# Patient Record
Sex: Female | Born: 1985
Health system: Southern US, Community
[De-identification: ages and names within clinical notes are randomized; demographics above are authoritative.]

## PROBLEM LIST (undated history)

## (undated) ENCOUNTER — Inpatient Hospital Stay (HOSPITAL_COMMUNITY): Payer: Self-pay

## (undated) DIAGNOSIS — N189 Chronic kidney disease, unspecified: Secondary | ICD-10-CM

## (undated) DIAGNOSIS — D649 Anemia, unspecified: Secondary | ICD-10-CM

## (undated) DIAGNOSIS — F32A Depression, unspecified: Secondary | ICD-10-CM

## (undated) DIAGNOSIS — F329 Major depressive disorder, single episode, unspecified: Secondary | ICD-10-CM

## (undated) DIAGNOSIS — Z8759 Personal history of other complications of pregnancy, childbirth and the puerperium: Secondary | ICD-10-CM

## (undated) DIAGNOSIS — I10 Essential (primary) hypertension: Secondary | ICD-10-CM

## (undated) DIAGNOSIS — Z87440 Personal history of urinary (tract) infections: Secondary | ICD-10-CM

## (undated) HISTORY — PX: OTHER SURGICAL HISTORY: SHX169

## (undated) HISTORY — DX: Essential (primary) hypertension: I10

## (undated) HISTORY — DX: Personal history of urinary (tract) infections: Z87.440

## (undated) HISTORY — DX: Personal history of urinary (tract) infections: Z87.59

## (undated) HISTORY — DX: Depression, unspecified: F32.A

## (undated) HISTORY — PX: WISDOM TOOTH EXTRACTION: SHX21

## (undated) HISTORY — DX: Major depressive disorder, single episode, unspecified: F32.9

---

## 2004-11-21 ENCOUNTER — Ambulatory Visit (HOSPITAL_COMMUNITY): Admission: RE | Admit: 2004-11-21 | Discharge: 2004-11-21 | Payer: Self-pay | Admitting: Urology

## 2010-07-27 ENCOUNTER — Encounter: Payer: Self-pay | Admitting: Urology

## 2011-05-09 ENCOUNTER — Inpatient Hospital Stay (INDEPENDENT_AMBULATORY_CARE_PROVIDER_SITE_OTHER)
Admission: RE | Admit: 2011-05-09 | Discharge: 2011-05-09 | Disposition: A | Discharge status: Home / Self Care | Payer: Commercial Managed Care - PPO | Source: Ambulatory Visit | Attending: Family Medicine | Admitting: Family Medicine

## 2011-05-09 DIAGNOSIS — N390 Urinary tract infection, site not specified: Secondary | ICD-10-CM

## 2011-05-09 DIAGNOSIS — J029 Acute pharyngitis, unspecified: Secondary | ICD-10-CM

## 2011-05-09 LAB — POCT RAPID STREP A: Streptococcus, Group A Screen (Direct): NEGATIVE

## 2011-05-09 LAB — POCT URINALYSIS DIP (DEVICE)
Glucose, UA: NEGATIVE mg/dL
Hgb urine dipstick: NEGATIVE
Ketones, ur: NEGATIVE mg/dL
Protein, ur: NEGATIVE mg/dL
Urobilinogen, UA: 0.2 mg/dL (ref 0.0–1.0)
pH: 6 (ref 5.0–8.0)

## 2015-07-14 ENCOUNTER — Telehealth: Payer: Self-pay | Admitting: Family

## 2015-07-14 DIAGNOSIS — B373 Candidiasis of vulva and vagina: Secondary | ICD-10-CM

## 2015-07-14 DIAGNOSIS — B3731 Acute candidiasis of vulva and vagina: Secondary | ICD-10-CM

## 2015-07-14 DIAGNOSIS — N390 Urinary tract infection, site not specified: Secondary | ICD-10-CM

## 2015-07-14 MED ORDER — CIPROFLOXACIN HCL 500 MG PO TABS: 500.0000 mg | ORAL_TABLET | Freq: Two times a day (BID) | ORAL | Status: DC

## 2015-07-14 MED ORDER — FLUCONAZOLE 150 MG PO TABS: 150.0000 mg | ORAL_TABLET | Freq: Once | ORAL | Status: DC

## 2015-07-14 NOTE — Progress Notes (Signed)
We are sorry that you are not feeling well.  Here is how we plan to help!  Based on what you shared with me it looks like you most likely have a simple urinary tract infection.  A UTI (Urinary Tract Infection) is a bacterial infection of the bladder.  Most cases of urinary tract infections are simple to treat but a key part of your care is to encourage you to drink plenty of fluids and watch your symptoms carefully.  I have prescribed Ciprofloxacin 500 mg twice a day for 5 days.  Your symptoms should gradually improve. Call us if the burning in your urine worsens, you develop worsening fever, back pain or pelvic pain or if your symptoms do not resolve after completing the antibiotic.  I have also prescribed Diflucan 150mg  as per your request for potential yeast infection.   Urinary tract infections can be prevented by drinking plenty of water to keep your body hydrated.  Also be sure when you wipe, wipe from front to back and don't hold it in!  If possible, empty your bladder every 4 hours.  Your e-visit answers were reviewed by a board certified advanced clinical practitioner to complete your personal care plan.  Depending on the condition, your plan could have included both over the counter or prescription medications.  If there is a problem please reply  once you have received a response from your provider.  Your safety is important to [place].  If you have drug allergies check your prescription carefully.    You can use MyChart to ask questions about today's visit, request a non-urgent call back, or ask for a work or school excuse for 24 hours related to this e-Visit. If it has been greater than 24 hours you will need to follow up with your provider, or enter a new e-Visit to address those concerns.   You will get an e-mail in the next two days asking about your experience.  I hope that your e-visit has been valuable and will speed your recovery. Thank you for using e-visits.

## 2015-10-29 DIAGNOSIS — M542 Cervicalgia: Secondary | ICD-10-CM | POA: Diagnosis not present

## 2015-10-29 DIAGNOSIS — M50322 Other cervical disc degeneration at C5-C6 level: Secondary | ICD-10-CM | POA: Diagnosis not present

## 2015-10-29 DIAGNOSIS — R110 Nausea: Secondary | ICD-10-CM | POA: Diagnosis not present

## 2015-10-29 DIAGNOSIS — M25511 Pain in right shoulder: Secondary | ICD-10-CM | POA: Diagnosis not present

## 2015-11-01 DIAGNOSIS — M5412 Radiculopathy, cervical region: Secondary | ICD-10-CM | POA: Diagnosis not present

## 2015-11-01 DIAGNOSIS — M5090 Cervical disc disorder, unspecified, unspecified cervical region: Secondary | ICD-10-CM | POA: Diagnosis not present

## 2015-11-08 ENCOUNTER — Other Ambulatory Visit (HOSPITAL_BASED_OUTPATIENT_CLINIC_OR_DEPARTMENT_OTHER): Payer: Self-pay | Admitting: Sports Medicine

## 2015-11-08 DIAGNOSIS — M5412 Radiculopathy, cervical region: Secondary | ICD-10-CM

## 2015-11-23 ENCOUNTER — Ambulatory Visit (HOSPITAL_BASED_OUTPATIENT_CLINIC_OR_DEPARTMENT_OTHER)
Admission: RE | Admit: 2015-11-23 | Discharge: 2015-11-23 | Disposition: A | Discharge status: Home / Self Care | Payer: 59 | Source: Ambulatory Visit | Attending: Sports Medicine | Admitting: Sports Medicine

## 2015-11-23 DIAGNOSIS — M50221 Other cervical disc displacement at C4-C5 level: Secondary | ICD-10-CM | POA: Diagnosis not present

## 2015-11-23 DIAGNOSIS — M50222 Other cervical disc displacement at C5-C6 level: Secondary | ICD-10-CM | POA: Insufficient documentation

## 2015-11-23 DIAGNOSIS — M4802 Spinal stenosis, cervical region: Secondary | ICD-10-CM | POA: Insufficient documentation

## 2015-11-23 DIAGNOSIS — M5412 Radiculopathy, cervical region: Secondary | ICD-10-CM | POA: Diagnosis not present

## 2015-11-23 DIAGNOSIS — M50223 Other cervical disc displacement at C6-C7 level: Secondary | ICD-10-CM | POA: Insufficient documentation

## 2015-11-29 ENCOUNTER — Telehealth: Payer: 59 | Admitting: Family

## 2015-11-29 DIAGNOSIS — B373 Candidiasis of vulva and vagina: Secondary | ICD-10-CM | POA: Diagnosis not present

## 2015-11-29 DIAGNOSIS — N390 Urinary tract infection, site not specified: Secondary | ICD-10-CM

## 2015-11-29 DIAGNOSIS — B3731 Acute candidiasis of vulva and vagina: Secondary | ICD-10-CM

## 2015-11-29 MED ORDER — CIPROFLOXACIN HCL 500 MG PO TABS: 500.0000 mg | ORAL_TABLET | Freq: Two times a day (BID) | ORAL | Status: DC

## 2015-11-29 MED ORDER — FLUCONAZOLE 150 MG PO TABS: 150.0000 mg | ORAL_TABLET | Freq: Once | ORAL | Status: DC

## 2015-11-29 NOTE — Progress Notes (Signed)
We are sorry that you are not feeling well.  Here is how we plan to help!  Based on what you shared with me it looks like you most likely have a simple urinary tract infection.  A UTI (Urinary Tract Infection) is a bacterial infection of the bladder.  Most cases of urinary tract infections are simple to treat but a key part of your care is to encourage you to drink plenty of fluids and watch your symptoms carefully.  I have prescribed Ciprofloxacin 500 mg twice a day for 5 days.  Your symptoms should gradually improve. Call us if the burning in your urine worsens, you develop worsening fever, back pain or pelvic pain or if your symptoms do not resolve after completing the antibiotic.  I have also prescribed Diflucan 150mg  x 1 dose as requested.   Urinary tract infections can be prevented by drinking plenty of water to keep your body hydrated.  Also be sure when you wipe, wipe from front to back and don't hold it in!  If possible, empty your bladder every 4 hours.  Your e-visit answers were reviewed by a board certified advanced clinical practitioner to complete your personal care plan.  Depending on the condition, your plan could have included both over the counter or prescription medications.  If there is a problem please reply  once you have received a response from your provider.  Your safety is important to [place].  If you have drug allergies check your prescription carefully.    You can use MyChart to ask questions about today's visit, request a non-urgent call back, or ask for a work or school excuse for 24 hours related to this e-Visit. If it has been greater than 24 hours you will need to follow up with your provider, or enter a new e-Visit to address those concerns.   You will get an e-mail in the next two days asking about your experience.  I hope that your e-visit has been valuable and will speed your recovery. Thank you for using e-visits.

## 2016-01-30 ENCOUNTER — Telehealth: Payer: 59 | Admitting: Physician Assistant

## 2016-01-30 DIAGNOSIS — N390 Urinary tract infection, site not specified: Secondary | ICD-10-CM | POA: Diagnosis not present

## 2016-01-30 DIAGNOSIS — B3731 Acute candidiasis of vulva and vagina: Secondary | ICD-10-CM

## 2016-01-30 DIAGNOSIS — B373 Candidiasis of vulva and vagina: Secondary | ICD-10-CM | POA: Diagnosis not present

## 2016-01-30 MED ORDER — FLUCONAZOLE 150 MG PO TABS: 150.0000 mg | ORAL_TABLET | Freq: Once | ORAL | 0 refills | Status: AC

## 2016-01-30 MED ORDER — CIPROFLOXACIN HCL 500 MG PO TABS: 500.0000 mg | ORAL_TABLET | Freq: Two times a day (BID) | ORAL | 0 refills | Status: DC

## 2016-01-30 MED FILL — FLUCONAZOLE 150 MG TABLET: 150 | 1 days supply | Qty: 1 | Fill #0

## 2016-01-30 MED FILL — CIPROFLOXACIN HCL 500 MG TA: 500 | 5 days supply | Qty: 10 | Fill #0

## 2016-01-30 NOTE — Progress Notes (Signed)
We are sorry that you are not feeling well.  Here is how we plan to help!  Based on what you shared with me it looks like you most likely have a simple urinary tract infection.  A UTI (Urinary Tract Infection) is a bacterial infection of the bladder.  Most cases of urinary tract infections are simple to treat but a key part of your care is to encourage you to drink plenty of fluids and watch your symptoms carefully.  I have prescribed Ciprofloxacin 500 mg twice a day for 5 days. I have also sent in 1 Diflucan in case of yeast from antibiotic use.  Your symptoms should gradually improve. Call us if the burning in your urine worsens, you develop worsening fever, back pain or pelvic pain or if your symptoms do not resolve after completing the antibiotic.  Urinary tract infections can be prevented by drinking plenty of water to keep your body hydrated.  Also be sure when you wipe, wipe from front to back and don't hold it in!  If possible, empty your bladder every 4 hours.  Your e-visit answers were reviewed by a board certified advanced clinical practitioner to complete your personal care plan.  Depending on the condition, your plan could have included both over the counter or prescription medications.  If there is a problem please reply  once you have received a response from your provider.  Your safety is important to [place].  If you have drug allergies check your prescription carefully.    You can use MyChart to ask questions about today's visit, request a non-urgent call back, or ask for a work or school excuse for 24 hours related to this e-Visit. If it has been greater than 24 hours you will need to follow up with your provider, or enter a new e-Visit to address those concerns.   You will get an e-mail in the next two days asking about your experience.  I hope that your e-visit has been valuable and will speed your recovery. Thank you for using e-visits.

## 2016-02-14 DIAGNOSIS — R42 Dizziness and giddiness: Secondary | ICD-10-CM | POA: Diagnosis not present

## 2016-02-14 DIAGNOSIS — R55 Syncope and collapse: Secondary | ICD-10-CM | POA: Diagnosis not present

## 2016-02-14 DIAGNOSIS — R000 Tachycardia, unspecified: Secondary | ICD-10-CM | POA: Diagnosis not present

## 2016-02-14 DIAGNOSIS — R5381 Other malaise: Secondary | ICD-10-CM | POA: Diagnosis not present

## 2016-02-14 DIAGNOSIS — R5383 Other fatigue: Secondary | ICD-10-CM | POA: Diagnosis not present

## 2016-04-08 DIAGNOSIS — R6882 Decreased libido: Secondary | ICD-10-CM | POA: Diagnosis not present

## 2016-04-08 DIAGNOSIS — N898 Other specified noninflammatory disorders of vagina: Secondary | ICD-10-CM | POA: Diagnosis not present

## 2016-04-10 DIAGNOSIS — M5412 Radiculopathy, cervical region: Secondary | ICD-10-CM | POA: Diagnosis not present

## 2016-04-10 DIAGNOSIS — M5090 Cervical disc disorder, unspecified, unspecified cervical region: Secondary | ICD-10-CM | POA: Diagnosis not present

## 2016-04-23 ENCOUNTER — Telehealth: Payer: 59 | Admitting: Physician Assistant

## 2016-04-23 DIAGNOSIS — R300 Dysuria: Secondary | ICD-10-CM

## 2016-04-23 DIAGNOSIS — N390 Urinary tract infection, site not specified: Secondary | ICD-10-CM

## 2016-04-23 MED ORDER — CIPROFLOXACIN HCL 500 MG PO TABS: 500.0000 mg | ORAL_TABLET | Freq: Two times a day (BID) | ORAL | 0 refills | Status: DC

## 2016-04-23 MED FILL — CIPROFLOXACIN HCL 500 MG TA: 500 | 5 days supply | Qty: 10 | Fill #0

## 2016-04-23 NOTE — Progress Notes (Signed)

## 2016-06-01 DIAGNOSIS — H52223 Regular astigmatism, bilateral: Secondary | ICD-10-CM | POA: Diagnosis not present

## 2016-06-01 DIAGNOSIS — H5213 Myopia, bilateral: Secondary | ICD-10-CM | POA: Diagnosis not present

## 2016-07-05 ENCOUNTER — Telehealth: Payer: 59 | Admitting: Nurse Practitioner

## 2016-07-05 DIAGNOSIS — J0101 Acute recurrent maxillary sinusitis: Secondary | ICD-10-CM

## 2016-07-05 MED ORDER — AMOXICILLIN-POT CLAVULANATE 875-125 MG PO TABS: 1.0000 | ORAL_TABLET | Freq: Two times a day (BID) | ORAL | 0 refills | Status: DC

## 2016-07-05 NOTE — Progress Notes (Signed)

## 2016-08-09 ENCOUNTER — Telehealth: Payer: 59 | Admitting: Family

## 2016-08-09 DIAGNOSIS — R399 Unspecified symptoms and signs involving the genitourinary system: Secondary | ICD-10-CM | POA: Diagnosis not present

## 2016-08-09 MED ORDER — NITROFURANTOIN MONOHYD MACRO 100 MG PO CAPS: 100.0000 mg | ORAL_CAPSULE | Freq: Two times a day (BID) | ORAL | 0 refills | Status: DC

## 2016-08-09 NOTE — Progress Notes (Signed)

## 2016-09-29 ENCOUNTER — Telehealth: Payer: 59 | Admitting: Nurse Practitioner

## 2016-09-29 DIAGNOSIS — O2311 Infections of bladder in pregnancy, first trimester: Secondary | ICD-10-CM | POA: Diagnosis not present

## 2016-09-29 MED ORDER — NITROFURANTOIN MONOHYD MACRO 100 MG PO CAPS: 100.0000 mg | ORAL_CAPSULE | Freq: Two times a day (BID) | ORAL | 0 refills | Status: AC

## 2016-09-29 MED FILL — NITROFURANTOIN MONO-MCR 100: 100 | 5 days supply | Qty: 10 | Fill #0

## 2016-09-29 NOTE — Progress Notes (Signed)
We are sorry that you are not feeling well.  Here is how we plan to help!  Based on what you shared with me it looks like you most likely have a simple urinary tract infection.  A UTI (Urinary Tract Infection) is a bacterial infection of the bladder.  Most cases of urinary tract infections are simple to treat but a key part of your care is to encourage you to drink plenty of fluids and watch your symptoms carefully.  I have prescribed MacroBid 100 mg twice a day for 5 days.  Your symptoms should gradually improve. Call us if the burning in your urine worsens, you develop worsening fever, back pain or pelvic pain or if your symptoms do not resolve after completing the antibiotic.  Urinary tract infections can be prevented by drinking plenty of water to keep your body hydrated.  Also be sure when you wipe, wipe from front to back and don't hold it in!  If possible, empty your bladder every 4 hours.  You should follow up with your OB/Gyn as soon as possible due to the frequency of your urinary symptoms.    Your e-visit answers were reviewed by a board certified advanced clinical practitioner to complete your personal care plan.  Depending on the condition, your plan could have included both over the counter or prescription medications.  If there is a problem please reply  once you have received a response from your provider.  Your safety is important to [place].  If you have drug allergies check your prescription carefully.    You can use MyChart to ask questions about today's visit, request a non-urgent call back, or ask for a work or school excuse for 24 hours related to this e-Visit. If it has been greater than 24 hours you will need to follow up with your provider, or enter a new e-Visit to address those concerns.   You will get an e-mail in the next two days asking about your experience.  I hope that your e-visit has been valuable and will speed your recovery. Thank you for using  e-visits.

## 2016-10-01 DIAGNOSIS — O209 Hemorrhage in early pregnancy, unspecified: Secondary | ICD-10-CM | POA: Diagnosis not present

## 2016-10-06 DIAGNOSIS — O039 Complete or unspecified spontaneous abortion without complication: Secondary | ICD-10-CM | POA: Diagnosis not present

## 2016-10-13 DIAGNOSIS — O021 Missed abortion: Secondary | ICD-10-CM | POA: Diagnosis not present

## 2016-10-13 DIAGNOSIS — O034 Incomplete spontaneous abortion without complication: Secondary | ICD-10-CM | POA: Diagnosis not present

## 2016-10-22 DIAGNOSIS — O021 Missed abortion: Secondary | ICD-10-CM | POA: Diagnosis not present

## 2017-05-18 MED FILL — valACYclovir HCL 1 GM TABS: 1 | 1 days supply | Qty: 4 | Fill #0

## 2017-06-08 ENCOUNTER — Telehealth: Payer: 59 | Admitting: Family

## 2017-06-08 DIAGNOSIS — J019 Acute sinusitis, unspecified: Secondary | ICD-10-CM

## 2017-06-08 DIAGNOSIS — B9689 Other specified bacterial agents as the cause of diseases classified elsewhere: Secondary | ICD-10-CM | POA: Diagnosis not present

## 2017-06-08 MED ORDER — AMOXICILLIN-POT CLAVULANATE 875-125 MG PO TABS: 1.0000 | ORAL_TABLET | Freq: Two times a day (BID) | ORAL | 0 refills | Status: DC

## 2017-06-08 MED FILL — AMOX-CLAV 875-125 MG TABLET: 875-125 | 7 days supply | Qty: 14 | Fill #0

## 2017-06-08 NOTE — Progress Notes (Signed)

## 2017-07-06 ENCOUNTER — Telehealth: Payer: 59 | Admitting: Family

## 2017-07-06 DIAGNOSIS — J019 Acute sinusitis, unspecified: Secondary | ICD-10-CM

## 2017-07-06 DIAGNOSIS — B9689 Other specified bacterial agents as the cause of diseases classified elsewhere: Secondary | ICD-10-CM

## 2017-07-06 MED ORDER — CEPHALEXIN 500 MG PO CAPS: 500.0000 mg | ORAL_CAPSULE | Freq: Three times a day (TID) | ORAL | 0 refills | Status: DC

## 2017-07-06 NOTE — L&D Delivery Note (Signed)
Delivery Note At 9:22 AM a viable female was delivered via VBAC, Spontaneous (PresentationOA: ;  ).  APGAR: , ; weight  .   Placenta statusSPONT..INTCT: , .  Cord:  with the following complications: .  Cord pH: not sen  Anesthesia:  EPID Episiotomy: None Lacerations: FIRST DEG  Suture Repair: 3.0 vicryl rapide Est. Blood Loss (mL):   300 Mom to postpartum.  Baby to Couplet care / Skin to Skin.  [name] 02/17/2018, 9:31 AM

## 2017-07-06 NOTE — Progress Notes (Signed)
We are sorry that you are not feeling well.  Here is how we plan to help!  Based on what you have shared with me it looks like you have sinusitis.  Sinusitis is inflammation and infection in the sinus cavities of the head.  Based on your presentation I believe you most likely have Acute Bacterial Sinusitis.  This is an infection caused by bacteria and is treated with antibiotics. I have prescribed Keflex 500 mg three times a day for 7 days. This is safe during pregnancy. You may use an oral decongestant such as Mucinex D or if you have glaucoma or high blood pressure use plain Mucinex. Saline nasal spray help and can safely be used as often as needed for congestion.  If you develop worsening sinus pain, fever or notice severe headache and vision changes, or if symptoms are not better after completion of antibiotic, please schedule an appointment with a health care provider.    Sinus infections are not as easily transmitted as other respiratory infection, however we still recommend that you avoid close contact with loved ones, especially the very young and elderly.  Remember to wash your hands thoroughly throughout the day as this is the number one way to prevent the spread of infection!  Home Care:  Only take medications as instructed by your medical team.  Complete the entire course of an antibiotic.  Do not take these medications with alcohol.  A steam or ultrasonic humidifier can help congestion.  You can place a towel over your head and breathe in the steam from hot water coming from a faucet.  Avoid close contacts especially the very young and the elderly.  Cover your mouth when you cough or sneeze.  Always remember to wash your hands.  Get Help Right Away If:  You develop worsening fever or sinus pain.  You develop a severe head ache or visual changes.  Your symptoms persist after you have completed your treatment plan.  Make sure you  Understand these instructions.  Will  watch your condition.  Will get help right away if you are not doing well or get worse.  Your e-visit answers were reviewed by a board certified advanced clinical practitioner to complete your personal care plan.  Depending on the condition, your plan could have included both over the counter or prescription medications.  If there is a problem please reply  once you have received a response from your provider.  Your safety is important to [place].  If you have drug allergies check your prescription carefully.    You can use MyChart to ask questions about today's visit, request a non-urgent call back, or ask for a work or school excuse for 24 hours related to this e-Visit. If it has been greater than 24 hours you will need to follow up with your provider, or enter a new e-Visit to address those concerns.  You will get an e-mail in the next two days asking about your experience.  I hope that your e-visit has been valuable and will speed your recovery. Thank you for using e-visits.

## 2017-07-14 DIAGNOSIS — N911 Secondary amenorrhea: Secondary | ICD-10-CM | POA: Diagnosis not present

## 2017-07-14 MED FILL — DICLEGIS DR 10-10 MG TABLET: 10-10 | 30 days supply | Qty: 60 | Fill #0

## 2017-07-22 DIAGNOSIS — Z3685 Encounter for antenatal screening for Streptococcus B: Secondary | ICD-10-CM | POA: Diagnosis not present

## 2017-07-22 DIAGNOSIS — Z3481 Encounter for supervision of other normal pregnancy, first trimester: Secondary | ICD-10-CM | POA: Diagnosis not present

## 2017-07-22 LAB — OB RESULTS CONSOLE ABO/RH: RH TYPE: POSITIVE

## 2017-07-22 LAB — OB RESULTS CONSOLE HEPATITIS B SURFACE ANTIGEN: Hepatitis B Surface Ag: NEGATIVE

## 2017-07-22 LAB — OB RESULTS CONSOLE RUBELLA ANTIBODY, IGM: Rubella: NON-IMMUNE/NOT IMMUNE

## 2017-07-22 LAB — OB RESULTS CONSOLE GC/CHLAMYDIA
CHLAMYDIA, DNA PROBE: NEGATIVE
Gonorrhea: NEGATIVE

## 2017-07-22 LAB — OB RESULTS CONSOLE RPR: RPR: NONREACTIVE

## 2017-07-22 LAB — OB RESULTS CONSOLE ANTIBODY SCREEN: Antibody Screen: NEGATIVE

## 2017-07-22 LAB — OB RESULTS CONSOLE HIV ANTIBODY (ROUTINE TESTING): HIV: NONREACTIVE

## 2017-08-03 DIAGNOSIS — Z113 Encounter for screening for infections with a predominantly sexual mode of transmission: Secondary | ICD-10-CM | POA: Diagnosis not present

## 2017-08-03 DIAGNOSIS — Z3480 Encounter for supervision of other normal pregnancy, unspecified trimester: Secondary | ICD-10-CM | POA: Diagnosis not present

## 2017-08-03 DIAGNOSIS — Z3491 Encounter for supervision of normal pregnancy, unspecified, first trimester: Secondary | ICD-10-CM | POA: Diagnosis not present

## 2017-08-10 DIAGNOSIS — Z3683 Encounter for fetal screening for congenital cardiac abnormalities: Secondary | ICD-10-CM | POA: Diagnosis not present

## 2017-08-10 DIAGNOSIS — Z36 Encounter for antenatal screening for chromosomal anomalies: Secondary | ICD-10-CM | POA: Diagnosis not present

## 2017-08-10 DIAGNOSIS — Z3491 Encounter for supervision of normal pregnancy, unspecified, first trimester: Secondary | ICD-10-CM | POA: Diagnosis not present

## 2017-08-10 DIAGNOSIS — Z3A12 12 weeks gestation of pregnancy: Secondary | ICD-10-CM | POA: Diagnosis not present

## 2017-08-26 MED FILL — NITROFURANTOIN MONO-MCR 100: 100 | 15 days supply | Qty: 30 | Fill #0

## 2017-09-02 DIAGNOSIS — Z3480 Encounter for supervision of other normal pregnancy, unspecified trimester: Secondary | ICD-10-CM | POA: Diagnosis not present

## 2017-09-24 DIAGNOSIS — Z363 Encounter for antenatal screening for malformations: Secondary | ICD-10-CM | POA: Diagnosis not present

## 2017-09-24 DIAGNOSIS — Z3400 Encounter for supervision of normal first pregnancy, unspecified trimester: Secondary | ICD-10-CM | POA: Diagnosis not present

## 2017-10-20 DIAGNOSIS — M25571 Pain in right ankle and joints of right foot: Secondary | ICD-10-CM | POA: Diagnosis not present

## 2017-11-12 DIAGNOSIS — Z3492 Encounter for supervision of normal pregnancy, unspecified, second trimester: Secondary | ICD-10-CM | POA: Diagnosis not present

## 2017-11-12 DIAGNOSIS — Z3686 Encounter for antenatal screening for cervical length: Secondary | ICD-10-CM | POA: Diagnosis not present

## 2017-11-17 DIAGNOSIS — Z3480 Encounter for supervision of other normal pregnancy, unspecified trimester: Secondary | ICD-10-CM | POA: Diagnosis not present

## 2017-11-17 DIAGNOSIS — Z23 Encounter for immunization: Secondary | ICD-10-CM | POA: Diagnosis not present

## 2017-11-17 DIAGNOSIS — Z3400 Encounter for supervision of normal first pregnancy, unspecified trimester: Secondary | ICD-10-CM | POA: Diagnosis not present

## 2017-11-30 MED FILL — ESOMEPRAZOLE MAG DR 20 MG C: 20 | 30 days supply | Qty: 30 | Fill #0

## 2017-12-07 DIAGNOSIS — Z3A29 29 weeks gestation of pregnancy: Secondary | ICD-10-CM | POA: Diagnosis not present

## 2017-12-07 DIAGNOSIS — Z3400 Encounter for supervision of normal first pregnancy, unspecified trimester: Secondary | ICD-10-CM | POA: Diagnosis not present

## 2017-12-07 DIAGNOSIS — Z3686 Encounter for antenatal screening for cervical length: Secondary | ICD-10-CM | POA: Diagnosis not present

## 2017-12-28 DIAGNOSIS — Z3A32 32 weeks gestation of pregnancy: Secondary | ICD-10-CM | POA: Diagnosis not present

## 2017-12-28 DIAGNOSIS — Z3400 Encounter for supervision of normal first pregnancy, unspecified trimester: Secondary | ICD-10-CM | POA: Diagnosis not present

## 2017-12-28 DIAGNOSIS — Z3686 Encounter for antenatal screening for cervical length: Secondary | ICD-10-CM | POA: Diagnosis not present

## 2017-12-29 DIAGNOSIS — H5213 Myopia, bilateral: Secondary | ICD-10-CM | POA: Diagnosis not present

## 2017-12-29 DIAGNOSIS — H52223 Regular astigmatism, bilateral: Secondary | ICD-10-CM | POA: Diagnosis not present

## 2018-01-28 DIAGNOSIS — Z3480 Encounter for supervision of other normal pregnancy, unspecified trimester: Secondary | ICD-10-CM | POA: Diagnosis not present

## 2018-02-02 DIAGNOSIS — Z3400 Encounter for supervision of normal first pregnancy, unspecified trimester: Secondary | ICD-10-CM | POA: Diagnosis not present

## 2018-02-02 DIAGNOSIS — O368130 Decreased fetal movements, third trimester, not applicable or unspecified: Secondary | ICD-10-CM | POA: Diagnosis not present

## 2018-02-02 DIAGNOSIS — Z3A37 37 weeks gestation of pregnancy: Secondary | ICD-10-CM | POA: Diagnosis not present

## 2018-02-12 ENCOUNTER — Encounter (HOSPITAL_COMMUNITY): Payer: Self-pay

## 2018-02-12 ENCOUNTER — Inpatient Hospital Stay (HOSPITAL_COMMUNITY)
Admission: AD | Admit: 2018-02-12 | Discharge: 2018-02-12 | Disposition: A | Discharge status: Home / Self Care | Payer: 59 | Source: Ambulatory Visit | Attending: Obstetrics and Gynecology | Admitting: Obstetrics and Gynecology

## 2018-02-12 ENCOUNTER — Other Ambulatory Visit: Payer: Self-pay

## 2018-02-12 DIAGNOSIS — O2343 Unspecified infection of urinary tract in pregnancy, third trimester: Secondary | ICD-10-CM

## 2018-02-12 DIAGNOSIS — O26833 Pregnancy related renal disease, third trimester: Secondary | ICD-10-CM | POA: Diagnosis not present

## 2018-02-12 DIAGNOSIS — O26893 Other specified pregnancy related conditions, third trimester: Secondary | ICD-10-CM | POA: Diagnosis not present

## 2018-02-12 DIAGNOSIS — Z882 Allergy status to sulfonamides status: Secondary | ICD-10-CM | POA: Insufficient documentation

## 2018-02-12 DIAGNOSIS — R109 Unspecified abdominal pain: Secondary | ICD-10-CM | POA: Diagnosis not present

## 2018-02-12 DIAGNOSIS — Z3A38 38 weeks gestation of pregnancy: Secondary | ICD-10-CM

## 2018-02-12 DIAGNOSIS — N189 Chronic kidney disease, unspecified: Secondary | ICD-10-CM | POA: Insufficient documentation

## 2018-02-12 HISTORY — DX: Anemia, unspecified: D64.9

## 2018-02-12 HISTORY — DX: Chronic kidney disease, unspecified: N18.9

## 2018-02-12 LAB — URINALYSIS, ROUTINE W REFLEX MICROSCOPIC
Bilirubin Urine: NEGATIVE
GLUCOSE, UA: NEGATIVE mg/dL
Ketones, ur: NEGATIVE mg/dL
NITRITE: NEGATIVE
PROTEIN: NEGATIVE mg/dL
Specific Gravity, Urine: 1.005 (ref 1.005–1.030)
WBC, UA: >50 WBC/hpf — ABNORMAL HIGH (ref 0–5)
pH: 8 (ref 5.0–8.0)

## 2018-02-12 LAB — CBC WITH DIFFERENTIAL/PLATELET
Basophils Absolute: 0 10*3/uL (ref 0.0–0.1)
Basophils Relative: 0 %
Eosinophils Absolute: 0.1 10*3/uL (ref 0.0–0.7)
Eosinophils Relative: 1 %
HEMATOCRIT: 31.7 % — AB (ref 36.0–46.0)
Hemoglobin: 10.7 g/dL — ABNORMAL LOW (ref 12.0–15.0)
Lymphocytes Relative: 14 %
Lymphs Abs: 1.4 10*3/uL (ref 0.7–4.0)
MCH: 31.6 pg (ref 26.0–34.0)
MCHC: 33.8 g/dL (ref 30.0–36.0)
MCV: 93.5 fL (ref 78.0–100.0)
MONO ABS: 0.5 10*3/uL (ref 0.1–1.0)
MONOS PCT: 5 %
NEUTROS ABS: 7.9 10*3/uL — AB (ref 1.7–7.7)
Neutrophils Relative %: 80 %
Platelets: 137 10*3/uL — ABNORMAL LOW (ref 150–400)
RBC: 3.39 MIL/uL — ABNORMAL LOW (ref 3.87–5.11)
RDW: 14.3 % (ref 11.5–15.5)
WBC: 9.9 10*3/uL (ref 4.0–10.5)

## 2018-02-12 LAB — POCT PREGNANCY, URINE: Preg Test, Ur: POSITIVE — AB

## 2018-02-12 MED ORDER — CEFTRIAXONE SODIUM 1 G IJ SOLR
1.0000 g | INTRAMUSCULAR | Status: DC
  Administered 2018-02-12: 1 g via INTRAMUSCULAR
  Filled 2018-02-12: qty 10

## 2018-02-12 MED ORDER — FLUCONAZOLE 200 MG PO TABS: 200.0000 mg | ORAL_TABLET | Freq: Every day | ORAL | 0 refills | Status: DC

## 2018-02-12 MED ORDER — GI COCKTAIL ~~LOC~~: 30.0000 mL | Freq: Three times a day (TID) | ORAL | Status: DC | PRN

## 2018-02-12 MED ORDER — CEPHALEXIN 500 MG PO CAPS: 500.0000 mg | ORAL_CAPSULE | Freq: Four times a day (QID) | ORAL | 0 refills | Status: DC

## 2018-02-12 NOTE — Progress Notes (Addendum)
G5 P3 @ [redacted] wksga. Presents to triage for "UTI, flank pain, cramping". This started past Thursday.   Currently on prophylactic abx for chronic UTI  States hurts when voiding and has a smell to her urine when she has the UTI. Also states feels like she is unable to empty bladder completely  Denies LOF or bleeding. +FM. Picks up more thru the day.   1114 Urine collected and sent  1115: EFM applied.   Hx:  1st: SVD 2nd emergency c/s for fetal distress 3rd: VBAC 4th Plans for another VBAC   GBS done and noted negative on the 02/06/18 OB Prenatal entry  1212: checked on pt. Pt made aware that provider will be in the room shortly.   1234: provider at bs assessing.   Monitors off per provider.  1338: CBC resulted. Provider made aware.   1352: orders received for Rocephin IM injection. Pending verification from pharmacy. Pt made aware that Dr. Matthew Saras ordered for rocephin injection 1407: pharmacy notified for rocephin verification   1420 medicated per order 1424: provider made aware of pt wanting prophylaxis med for yeast infection dt several abx regimen.   D/c instructions received. Informed will d/c after 15-20 mins of receiving the IM injection. Pt verbalizes understanding.   1458: D/c instructions given along with abx orders with pt understanding. Pt left unit via ambulatory with SO

## 2018-02-12 NOTE — MAU Provider Note (Signed)
History   32-year-old G5 P0-1-1-3 at 38 weeks and 4 days and with bilateral flank pain that is been going on for several days it is worse on the right patient denies any fever.  She states she has pain with urination she also notices a odor to her urine.  He states she does have a history of frequent UTIs she is very aware of when she is starting to get one.  Denies rupture membranes or vaginal bleeding.  Denies any contractions.  CSN: [id number]  Arrival date & time 02/12/18  1055   None     No chief complaint on file.   HPI  Past Medical History:  Diagnosis Date  . Anemia    1st pregnancy - Fe  . Chronic kidney disease    all pregnancy dx pylonephristis - hospitalized + kidney stones  . Preterm labor      Family History  Problem Relation Age of Onset  . COPD Mother   . Vision loss Mother   . Alcohol abuse Father   . Depression Father   . Drug abuse Father   . ADD / ADHD Son   . Asthma Maternal Grandmother   . Heart disease Paternal Grandmother   . Stroke Paternal Grandmother   . Cancer Paternal Grandfather   . Diabetes Paternal Grandfather   . Heart disease Paternal Grandfather   . Hyperlipidemia Paternal Grandfather   . Hypertension Paternal Grandfather     Social History   Tobacco Use  . Smoking status: Never Smoker  . Smokeless tobacco: Never Used  Substance Use Topics  . Alcohol use: Not Currently    Comment: social   . Drug use: Never    OB History    Gravida  5   Para  3   Term      Preterm  1   AB  1   Living  3     SAB  1   TAB      Ectopic      Multiple      Live Births  3           Review of Systems  Constitutional: Negative.   HENT: Negative.   Eyes: Negative.   Respiratory: Negative.   Cardiovascular: Negative.   Gastrointestinal:       Superpubic pain with voiding.  Endocrine: Negative.   Genitourinary: Positive for dysuria and flank pain.  Skin: Negative.   Allergic/Immunologic: Negative.   Neurological:  Negative.   Hematological: Negative.   Psychiatric/Behavioral: Negative.     Allergies  Sulfa antibiotics  Home Medications    BP 127/72   Pulse 79   Temp 97.8 F (36.6 C) (Oral)   Resp 18   Wt 64.9 kg   Physical Exam  Constitutional: She is oriented to person, place, and time. She appears well-developed and well-nourished.  HENT:  Head: Normocephalic.  Neck: Normal range of motion.  Cardiovascular: Normal rate and regular rhythm.  Pulmonary/Chest: Effort normal and breath sounds normal.  Abdominal: Soft. Bowel sounds are normal.  Musculoskeletal:  bilateral CVAT worse on right.  Neurological: She is alert and oriented to person, place, and time.  Skin: Skin is warm and dry.  Psychiatric: She has a normal mood and affect. Her behavior is normal. Judgment and thought content normal.    MAU Course  Procedures (including critical care time)  Labs Reviewed  URINALYSIS, ROUTINE W REFLEX MICROSCOPIC - Abnormal; Notable for the following components:  Result Value   APPearance HAZY (*)    Hgb urine dipstick SMALL (*)    Leukocytes, UA LARGE (*)    WBC, UA >50 (*)    Bacteria, UA RARE (*)    All other components within normal limits  POCT PREGNANCY, URINE - Abnormal; Notable for the following components:   Preg Test, Ur POSITIVE (*)    All other components within normal limits  URINE CULTURE  CBC WITH DIFFERENTIAL/PLATELET   No results found.   No diagnosis found.    MDM  Vital signs are stable, urine large leuks greater than 50 WBCs, CBC is normal.  Plan of care discussed with Dr. Matthew Saras.  Given the fact that patient has frequent UTIs and is already on Rocephin Dr. Glyn Ade feels like she needs a shot of Rocephin IM and placed on Keflex 500 4 times daily to follow-up in the office in 1 week and if the culture grows out anything then the office will notify her of the results.  Fetal heart tone is reactive with 15 x 15 A cells no decelerations.  Only occasional  mild contraction patient is unaware of.  Will discharge patient home to follow-up next week.  She is to return if symptoms worsen or fever ensues.

## 2018-02-13 LAB — URINE CULTURE: Culture: NO GROWTH

## 2018-02-16 ENCOUNTER — Telehealth (HOSPITAL_COMMUNITY): Payer: Self-pay | Admitting: *Deleted

## 2018-02-16 ENCOUNTER — Encounter (HOSPITAL_COMMUNITY): Payer: Self-pay | Admitting: *Deleted

## 2018-02-16 LAB — OB RESULTS CONSOLE GBS: GBS: NEGATIVE

## 2018-02-16 NOTE — Telephone Encounter (Signed)
Preadmission screen  

## 2018-02-17 ENCOUNTER — Inpatient Hospital Stay (HOSPITAL_COMMUNITY)
Admission: RE | Admit: 2018-02-17 | Discharge: 2018-02-18 | DRG: 807 | Disposition: A | Discharge status: Home / Self Care | Payer: 59 | Attending: Obstetrics and Gynecology | Admitting: Obstetrics and Gynecology

## 2018-02-17 ENCOUNTER — Encounter (HOSPITAL_COMMUNITY): Payer: Self-pay

## 2018-02-17 ENCOUNTER — Other Ambulatory Visit: Payer: Self-pay

## 2018-02-17 ENCOUNTER — Inpatient Hospital Stay (HOSPITAL_COMMUNITY): Payer: 59 | Admitting: Anesthesiology

## 2018-02-17 DIAGNOSIS — Z3483 Encounter for supervision of other normal pregnancy, third trimester: Secondary | ICD-10-CM | POA: Diagnosis present

## 2018-02-17 DIAGNOSIS — O34219 Maternal care for unspecified type scar from previous cesarean delivery: Secondary | ICD-10-CM | POA: Diagnosis not present

## 2018-02-17 DIAGNOSIS — Z3A39 39 weeks gestation of pregnancy: Secondary | ICD-10-CM | POA: Diagnosis not present

## 2018-02-17 DIAGNOSIS — Z3490 Encounter for supervision of normal pregnancy, unspecified, unspecified trimester: Secondary | ICD-10-CM

## 2018-02-17 LAB — TYPE AND SCREEN
ABO/RH(D): O POS
ANTIBODY SCREEN: NEGATIVE

## 2018-02-17 LAB — CBC
HEMATOCRIT: 33.2 % — AB (ref 36.0–46.0)
HEMOGLOBIN: 10.9 g/dL — AB (ref 12.0–15.0)
MCH: 31.1 pg (ref 26.0–34.0)
MCHC: 32.8 g/dL (ref 30.0–36.0)
MCV: 94.6 fL (ref 78.0–100.0)
Platelets: 186 10*3/uL (ref 150–400)
RBC: 3.51 MIL/uL — ABNORMAL LOW (ref 3.87–5.11)
RDW: 14.4 % (ref 11.5–15.5)
WBC: 9.8 10*3/uL (ref 4.0–10.5)

## 2018-02-17 LAB — ABO/RH: ABO/RH(D): O POS

## 2018-02-17 MED ORDER — WITCH HAZEL-GLYCERIN EX PADS: 1.0000 "application " | MEDICATED_PAD | CUTANEOUS | Status: DC | PRN

## 2018-02-17 MED ORDER — LACTATED RINGERS IV SOLN
INTRAVENOUS | Status: DC
  Administered 2018-02-17 (×2): via INTRAVENOUS

## 2018-02-17 MED ORDER — COCONUT OIL OIL: 1.0000 "application " | TOPICAL_OIL | Status: DC | PRN

## 2018-02-17 MED ORDER — BENZOCAINE-MENTHOL 20-0.5 % EX AERO
1.0000 "application " | INHALATION_SPRAY | CUTANEOUS | Status: DC | PRN
  Administered 2018-02-17: 1 via TOPICAL
  Filled 2018-02-17: qty 56

## 2018-02-17 MED ORDER — FENTANYL 2.5 MCG/ML BUPIVACAINE 1/10 % EPIDURAL INFUSION (WH - ANES)
14.0000 mL/h | INTRAMUSCULAR | Status: DC | PRN
Start: 2018-02-17 — End: 2018-02-18
  Administered 2018-02-17: 14 mL/h via EPIDURAL

## 2018-02-17 MED ORDER — ACETAMINOPHEN 325 MG PO TABS: 650.0000 mg | ORAL_TABLET | ORAL | Status: DC | PRN

## 2018-02-17 MED ORDER — EPHEDRINE 5 MG/ML INJ
10.0000 mg | INTRAVENOUS | Status: DC | PRN
  Filled 2018-02-17: qty 2

## 2018-02-17 MED ORDER — TERBUTALINE SULFATE 1 MG/ML IJ SOLN
0.2500 mg | Freq: Once | INTRAMUSCULAR | Status: DC | PRN
  Filled 2018-02-17: qty 1

## 2018-02-17 MED ORDER — LACTATED RINGERS IV SOLN
500.0000 mL | Freq: Once | INTRAVENOUS | Status: AC
  Administered 2018-02-17: 500 mL via INTRAVENOUS

## 2018-02-17 MED ORDER — SIMETHICONE 80 MG PO CHEW: 80.0000 mg | CHEWABLE_TABLET | ORAL | Status: DC | PRN

## 2018-02-17 MED ORDER — OXYTOCIN 40 UNITS IN LACTATED RINGERS INFUSION - SIMPLE MED
1.0000 m[IU]/min | INTRAVENOUS | Status: DC
  Administered 2018-02-17: 2 m[IU]/min via INTRAVENOUS
  Administered 2018-02-17: 8 m[IU]/min via INTRAVENOUS
  Filled 2018-02-17: qty 1000

## 2018-02-17 MED ORDER — DIPHENHYDRAMINE HCL 25 MG PO CAPS: 25.0000 mg | ORAL_CAPSULE | Freq: Four times a day (QID) | ORAL | Status: DC | PRN

## 2018-02-17 MED ORDER — PRENATAL MULTIVITAMIN CH
1.0000 | ORAL_TABLET | Freq: Every day | ORAL | Status: DC
  Administered 2018-02-18: 1 via ORAL
  Filled 2018-02-17: qty 1

## 2018-02-17 MED ORDER — PHENYLEPHRINE 40 MCG/ML (10ML) SYRINGE FOR IV PUSH (FOR BLOOD PRESSURE SUPPORT)
80.0000 ug | PREFILLED_SYRINGE | INTRAVENOUS | Status: DC | PRN
  Filled 2018-02-17: qty 5

## 2018-02-17 MED ORDER — FLEET ENEMA 7-19 GM/118ML RE ENEM: 1.0000 | ENEMA | Freq: Every day | RECTAL | Status: DC | PRN

## 2018-02-17 MED ORDER — MEASLES, MUMPS & RUBELLA VAC ~~LOC~~ INJ
0.5000 mL | INJECTION | Freq: Once | SUBCUTANEOUS | Status: DC
  Filled 2018-02-17: qty 0.5

## 2018-02-17 MED ORDER — BISACODYL 10 MG RE SUPP: 10.0000 mg | Freq: Every day | RECTAL | Status: DC | PRN

## 2018-02-17 MED ORDER — IBUPROFEN 800 MG PO TABS
800.0000 mg | ORAL_TABLET | Freq: Three times a day (TID) | ORAL | Status: DC | PRN
Start: 2018-02-17 — End: 2018-02-18
  Administered 2018-02-17 – 2018-02-18 (×2): 800 mg via ORAL
  Filled 2018-02-17 (×2): qty 1

## 2018-02-17 MED ORDER — SENNOSIDES-DOCUSATE SODIUM 8.6-50 MG PO TABS: 2.0000 | ORAL_TABLET | ORAL | Status: DC

## 2018-02-17 MED ORDER — OXYTOCIN BOLUS FROM INFUSION
500.0000 mL | Freq: Once | INTRAVENOUS | Status: AC
  Administered 2018-02-17: 500 mL via INTRAVENOUS

## 2018-02-17 MED ORDER — PHENYLEPHRINE 40 MCG/ML (10ML) SYRINGE FOR IV PUSH (FOR BLOOD PRESSURE SUPPORT)
PREFILLED_SYRINGE | INTRAVENOUS | Status: AC
  Filled 2018-02-17: qty 20

## 2018-02-17 MED ORDER — OXYCODONE-ACETAMINOPHEN 5-325 MG PO TABS: 2.0000 | ORAL_TABLET | ORAL | Status: DC | PRN

## 2018-02-17 MED ORDER — LACTATED RINGERS IV SOLN: 500.0000 mL | INTRAVENOUS | Status: DC | PRN

## 2018-02-17 MED ORDER — TETANUS-DIPHTH-ACELL PERTUSSIS 5-2.5-18.5 LF-MCG/0.5 IM SUSP: 0.5000 mL | Freq: Once | INTRAMUSCULAR | Status: DC

## 2018-02-17 MED ORDER — DIPHENHYDRAMINE HCL 50 MG/ML IJ SOLN: 12.5000 mg | INTRAMUSCULAR | Status: DC | PRN

## 2018-02-17 MED ORDER — FENTANYL 2.5 MCG/ML BUPIVACAINE 1/10 % EPIDURAL INFUSION (WH - ANES)
INTRAMUSCULAR | Status: AC
  Filled 2018-02-17: qty 100

## 2018-02-17 MED ORDER — LIDOCAINE HCL (PF) 1 % IJ SOLN
INTRAMUSCULAR | Status: DC | PRN
  Administered 2018-02-17 (×2): 4 mL via EPIDURAL

## 2018-02-17 MED ORDER — OXYTOCIN 40 UNITS IN LACTATED RINGERS INFUSION - SIMPLE MED: 2.5000 [IU]/h | INTRAVENOUS | Status: DC

## 2018-02-17 MED ORDER — ONDANSETRON HCL 4 MG/2ML IJ SOLN: 4.0000 mg | INTRAMUSCULAR | Status: DC | PRN

## 2018-02-17 MED ORDER — OXYCODONE-ACETAMINOPHEN 5-325 MG PO TABS: 1.0000 | ORAL_TABLET | ORAL | Status: DC | PRN

## 2018-02-17 MED ORDER — ONDANSETRON HCL 4 MG/2ML IJ SOLN: 4.0000 mg | Freq: Four times a day (QID) | INTRAMUSCULAR | Status: DC | PRN

## 2018-02-17 MED ORDER — ZOLPIDEM TARTRATE 5 MG PO TABS: 5.0000 mg | ORAL_TABLET | Freq: Every evening | ORAL | Status: DC | PRN

## 2018-02-17 MED ORDER — SOD CITRATE-CITRIC ACID 500-334 MG/5ML PO SOLN: 30.0000 mL | ORAL | Status: DC | PRN

## 2018-02-17 MED ORDER — LIDOCAINE HCL (PF) 1 % IJ SOLN
30.0000 mL | INTRAMUSCULAR | Status: DC | PRN
  Filled 2018-02-17: qty 30

## 2018-02-17 MED ORDER — FLEET ENEMA 7-19 GM/118ML RE ENEM: 1.0000 | ENEMA | RECTAL | Status: DC | PRN

## 2018-02-17 MED ORDER — ONDANSETRON HCL 4 MG PO TABS: 4.0000 mg | ORAL_TABLET | ORAL | Status: DC | PRN

## 2018-02-17 MED ORDER — DIBUCAINE 1 % RE OINT: 1.0000 "application " | TOPICAL_OINTMENT | RECTAL | Status: DC | PRN

## 2018-02-17 NOTE — Lactation Note (Signed)
This note was copied from a baby's chart. Lactation Consultation Note  Patient Name: Ashley Horton THYHO'O Date: 02/17/2018 Reason for consult: Initial assessment;Term  78 hours old FT female who is being exclusively BF by his mother, she's a P4 and experienced BF. She didn't BF her first two kids, she exclusively pumped and bottle for both of them for 3 months, but she did BF her last baby for 7 months. Mom already knows how to hand express and she was able to see some colostrum.  Offered assistance with latch, but mom politely declined, baby has been spitty and she wanted to wait till baby wakes up on his own to try again. Asked her to call for latch assistance if needed. Discussed cluster feeding and baby sleeping cycle. 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  Encouraged mom to feed baby STS 8-12 times/24 hours or sooner if feeding cues are present. Reviewed BF brochure, BF resources and feeding diary, mom is aware of [facility] services and will call PRN.  Maternal Data Formula Feeding for Exclusion: No Has patient been taught Hand Expression?: Yes Does the patient have breastfeeding experience prior to this delivery?: Yes  Feeding Feeding Type: Breast Fed Length of feed: (attempted)  LATCH Score Latch: Grasps breast easily, tongue down, lips flanged, rhythmical sucking.  Audible Swallowing: A few with stimulation  Type of Nipple: Everted at rest and after stimulation  Comfort (Breast/Nipple): Soft / non-tender  Hold (Positioning): No assistance needed to correctly position infant at breast.  LATCH Score: 9  Interventions Interventions: Breast feeding basics reviewed;DEBP  Lactation Tools Discussed/Used Tools: Pump Breast pump type: Double-Electric Breast Pump WIC Program: No   Consult Status Consult Status: PRN Follow-up type: Call as needed    [place] 02/17/2018, 11:41 PM

## 2018-02-17 NOTE — Anesthesia Pain Management Evaluation Note (Signed)
  CRNA Pain Management Visit Note  Patient: Ashley Horton, 32 y.o., female  "Hello I am a member of the anesthesia team at [facility]. We have an anesthesia team available at all times to provide care throughout the hospital, including epidural management and anesthesia for C-section. I don't know your plan for the delivery whether it a natural birth, water birth, IV sedation, nitrous supplementation, doula or epidural, but we want to meet your pain goals."   1.Was your pain managed to your expectations on prior hospitalizations?   Yes   2.What is your expectation for pain management during this hospitalization?     Epidural  3.How can we help you reach that goal? epidural  Record the patient's initial score and the patient's pain goal.   Pain: 0  Pain Goal: 4 [facility] wants you to be able to say your pain was always managed very well.  Ashley Horton 02/17/2018

## 2018-02-17 NOTE — Anesthesia Postprocedure Evaluation (Signed)
Anesthesia Post Note  Patient: Ashley Horton  Procedure(s) Performed: AN AD [place]     Patient location during evaluation: Mother Baby Anesthesia Type: Epidural Level of consciousness: awake and alert Pain management: pain level controlled Vital Signs Assessment: post-procedure vital signs reviewed and stable Respiratory status: spontaneous breathing, nonlabored ventilation and respiratory function stable Cardiovascular status: stable Postop Assessment: no headache, no backache and epidural receding Anesthetic complications: no    Last Vitals:  Vitals:   02/17/18 1132 02/17/18 1146  BP: 108/62 111/64  Pulse: 75 68  Resp: 16 16  Temp:    SpO2:      Last Pain:  Vitals:   02/17/18 1559  TempSrc:   PainSc: 2    Pain Goal: Patients Stated Pain Goal: 7 (02/17/18 0104)               Gilmer Mor

## 2018-02-17 NOTE — H&P (Signed)
Ashley Horton is a 32 y.o. female presenting for IOL  @ term. OB History    Gravida  5   Para  3   Term      Preterm  1   AB  1   Living  3     SAB  1   TAB      Ectopic      Multiple      Live Births  3          Past Medical History:  Diagnosis Date  . Anemia    1st pregnancy - Fe  . Chronic kidney disease    all pregnancy dx pylonephristis - hospitalized + kidney stones  . Depression    third child took zoloft for 2-3 mos  . History of pyelonephritis during pregnancy   . Preterm labor    Past Surgical History:  Procedure Laterality Date  . CESAREAN SECTION    . urethro dilation    . urinary reflux surgery    . WISDOM TOOTH EXTRACTION     at age 37   Family History: family history includes ADD / ADHD in her son; Alcohol abuse in her father; Asthma in her maternal grandmother; COPD in her father; Cancer in her paternal grandfather; Depression in her father; Diabetes in her paternal grandfather; Drug abuse in her father; Heart disease in her paternal grandfather and paternal grandmother; Hyperlipidemia in her paternal grandfather; Hypertension in her paternal grandfather; Stroke in her paternal grandmother; Vision loss in her mother. Social History:  reports that she has never smoked. She has never used smokeless tobacco. She reports that she drank alcohol. She reports that she does not use drugs.     Maternal Diabetes: No Genetic Screening: Normal Maternal Ultrasounds/Referrals: Normal Fetal Ultrasounds or other Referrals:  None Maternal Substance Abuse:  No Significant Maternal Medications:  None Significant Maternal Lab Results:  None Other Comments:  None  ROS History Dilation: 3 Effacement (%): 80 Station: -2 Exam by:: Audria Nine, RN Blood pressure (!) 98/55, pulse 67, temperature (!) 97.5 F (36.4 C), temperature source Oral, resp. rate 14, height 5\' 7"  (1.702 m), weight 66.2 kg. Exam Physical Exam  Constitutional: She is oriented to  person, place, and time. She appears well-developed and well-nourished.  HENT:  Head: Normocephalic and atraumatic.  Neck: Normal range of motion. Neck supple.  Cardiovascular: Normal rate and regular rhythm.  Respiratory: Effort normal and breath sounds normal.  GI:  Term FH, FHR 142  Genitourinary:  Genitourinary Comments: 1/50/vtx  Musculoskeletal: Normal range of motion.  Neurological: She is alert and oriented to person, place, and time.    Prenatal labs: ABO, Rh: --/--/O POS, O POS Performed at [facility], [address], [place], [place] [address]  [phone]) Antibody: NEG (08/15 0115) Rubella: Nonimmune (01/17 0000) RPR: Nonreactive (01/17 0000)  HBsAg: Negative (01/17 0000)  HIV: Non-reactive (01/17 0000)  GBS: Negative (08/14 0000)   Assessment/Plan: Term IUP, for [place] 02/17/2018, 7:22 AM

## 2018-02-17 NOTE — Anesthesia Preprocedure Evaluation (Signed)
Anesthesia Evaluation  Patient identified by MRN, date of birth, ID band Patient awake    Reviewed: Allergy & Precautions, NPO status , Patient's Chart, lab work & pertinent test results  Airway Mallampati: II  TM Distance: >3 FB Neck ROM: Full    Dental   Pulmonary neg pulmonary ROS,    Pulmonary exam normal breath sounds clear to auscultation       Cardiovascular negative cardio ROS Normal cardiovascular exam Rhythm:Regular Rate:Normal     Neuro/Psych PSYCHIATRIC DISORDERS Depression negative neurological ROS     GI/Hepatic negative GI ROS, Neg liver ROS,   Endo/Other  negative endocrine ROS  Renal/GU Renal disease     Musculoskeletal negative musculoskeletal ROS (+)   Abdominal   Peds  Hematology  (+) anemia ,   Anesthesia Other Findings   Reproductive/Obstetrics (+) Pregnancy                             Anesthesia Physical Anesthesia Plan  ASA: II  Anesthesia Plan: Epidural   Post-op Pain Management:    Induction:   PONV Risk Score and Plan:   Airway Management Planned:   Additional Equipment:   Intra-op Plan:   Post-operative Plan:   Informed Consent: I have reviewed the patients History and Physical, chart, labs and discussed the procedure including the risks, benefits and alternatives for the proposed anesthesia with the patient or authorized representative who has indicated his/her understanding and acceptance.     Plan Discussed with:   Anesthesia Plan Comments:         Anesthesia Quick Evaluation

## 2018-02-17 NOTE — Progress Notes (Signed)
Just got epid>>4/90/-1, forebag rupt, FHR cat I

## 2018-02-17 NOTE — Anesthesia Procedure Notes (Signed)
Epidural Patient location during procedure: OB Start time: 02/17/2018 7:20 AM End time: 02/17/2018 7:35 AM  Staffing Anesthesiologist: Nolon Nations, MD Performed: anesthesiologist   Preanesthetic Checklist Completed: patient identified, pre-op evaluation, timeout performed, IV checked, risks and benefits discussed and monitors and equipment checked  Epidural Patient position: sitting Prep: site prepped and draped and DuraPrep Patient monitoring: heart rate, continuous pulse ox and blood pressure Approach: midline Location: L3-L4 Injection technique: LOR air and LOR saline  Needle:  Needle type: Tuohy  Needle gauge: 17 G Needle length: 9 cm Needle insertion depth: 5 cm Catheter type: closed end flexible Catheter size: 19 Gauge Catheter at skin depth: 10 cm Test dose: negative  Assessment Sensory level: T8 Events: blood not aspirated, injection not painful, no injection resistance, negative IV test and no paresthesia  Additional Notes Reason for block:procedure for pain

## 2018-02-18 LAB — CBC
HCT: 28.2 % — ABNORMAL LOW (ref 36.0–46.0)
Hemoglobin: 9.7 g/dL — ABNORMAL LOW (ref 12.0–15.0)
MCH: 32.2 pg (ref 26.0–34.0)
MCHC: 34.4 g/dL (ref 30.0–36.0)
MCV: 93.7 fL (ref 78.0–100.0)
PLATELETS: 146 10*3/uL — AB (ref 150–400)
RBC: 3.01 MIL/uL — AB (ref 3.87–5.11)
RDW: 14.4 % (ref 11.5–15.5)
WBC: 9.7 10*3/uL (ref 4.0–10.5)

## 2018-02-18 LAB — RPR: RPR Ser Ql: NONREACTIVE

## 2018-02-18 NOTE — Lactation Note (Signed)
This note was copied from a baby's chart. Lactation Consultation Note  Patient Name: Boy Areyanna Figeroa [id number] Date: 02/18/2018 Reason for consult: Follow-up assessment;Nipple pain/trauma Baby is 41 hours old and cluster fed last night.  Mom is c/o painful nipples.  She felt baby was latching well yesterday but not as well during the night.  Mom using cradle or football hold.  Baby is currently getting a bath.  Comfort gels given with instructions.  Instructed mom to call out for latch assist when baby ready to feed.  Maternal Data    Feeding    LATCH Score                   Interventions    Lactation Tools Discussed/Used     Consult Status Consult Status: Follow-up Date: 02/18/18 Follow-up type: In-patient    Ave Filter 02/18/2018, 11:41 AM

## 2018-02-18 NOTE — Progress Notes (Signed)
CSW acknowledges consult.  CSW attempted to meet with MOB, however bedside nurse was attending to [facility] and infant.    CSW will attempt to meet with MOB at a later time.   Laurey Arrow, MSW, LCSW Clinical Social Work [phone]

## 2018-02-18 NOTE — Discharge Summary (Signed)
Obstetric Discharge Summary Reason for Admission: induction of labor Prenatal Procedures: none Intrapartum Procedures: spontaneous vaginal delivery Postpartum Procedures: none Complications-Operative and Postpartum: 1st degree perineal laceration Hemoglobin  Date Value Ref Range Status  02/18/2018 9.7 (L) 12.0 - 15.0 g/dL Final   HCT  Date Value Ref Range Status  02/18/2018 28.2 (L) 36.0 - 46.0 % Final    Physical Exam:  General: alert, cooperative and appears stated age 68: appropriate Uterine Fundus: firm Incision: healing well, no significant drainage, no dehiscence, no significant erythema DVT Evaluation: No evidence of DVT seen on physical exam. Negative Homan's sign. No cords or calf tenderness. No significant calf/ankle edema.  Discharge Diagnoses: Term Pregnancy-delivered  Discharge Information: Date: 02/18/2018 Activity: pelvic rest Diet: routine Medications: None Condition: stable Instructions: refer to practice specific booklet Discharge to: home   Newborn Data: Live born female  Birth Weight: 7 lb 2.6 oz (3250 g) APGAR: 9, 9  Newborn Delivery   Birth date/time:  02/17/2018 09:22:00 Delivery type:  VBAC, Spontaneous    Declines circ. Home with mother.  Tyson Dense 02/18/2018, 8:53 AM

## 2018-02-18 NOTE — Progress Notes (Signed)
CSW received consult for hx of PPD.  CSW met with MOB to offer support and complete assessment.    When CSW arrived MOB and FOB were preparing to discharge.  CSW explained CSW's role and MOB gave CSW permission to complete the assessment while FOB was present.   CSW asked about MOB's PPD hx and MOB openly shared feelings of sadness, increase anxiety, and daily tearfulness. MOB shared that MOB's symptoms presented 2 weeks postpartum. MOB communicated that MOB consulted with MOB's PCP and was prescribed Zoloft.  MOB discontinued medication after 2 months and symptoms subsided.   CSW provided education regarding the baby blues period vs. perinatal mood disorders, discussed treatment and gave resources for mental health follow up if concerns arise.  CSW recommends self-evaluation during the postpartum time period using the New Mom Checklist from Postpartum Progress and encouraged MOB to contact a medical professional if symptoms are noted at any time.  CSW assessed for safety and MOB denied SI and HI.  MOB reported having a strong support team and feels prepared to parent.    CSW identifies no further need for intervention and no barriers to discharge at this time.  Laurey Arrow, MSW, LCSW Clinical Social Work [phone]'

## 2018-03-02 ENCOUNTER — Other Ambulatory Visit (HOSPITAL_COMMUNITY): Payer: Self-pay | Admitting: Obstetrics and Gynecology

## 2018-03-02 ENCOUNTER — Ambulatory Visit (HOSPITAL_COMMUNITY)
Admission: RE | Admit: 2018-03-02 | Discharge: 2018-03-02 | Disposition: A | Discharge status: Home / Self Care | Payer: 59 | Source: Ambulatory Visit | Attending: Obstetrics and Gynecology | Admitting: Obstetrics and Gynecology

## 2018-03-02 DIAGNOSIS — R109 Unspecified abdominal pain: Secondary | ICD-10-CM

## 2018-03-02 DIAGNOSIS — N390 Urinary tract infection, site not specified: Secondary | ICD-10-CM | POA: Diagnosis not present

## 2018-03-02 DIAGNOSIS — N10 Acute tubulo-interstitial nephritis: Secondary | ICD-10-CM | POA: Diagnosis not present

## 2018-03-02 MED FILL — AMOX-CLAV 875-125 MG TABLET: 875-125 | 7 days supply | Qty: 14 | Fill #0

## 2018-03-02 MED FILL — OXYCODONE-ACETAMINOPHEN 10-: 10-325 | 3 days supply | Qty: 20 | Fill #0

## 2018-03-14 DIAGNOSIS — Z760 Encounter for issue of repeat prescription: Secondary | ICD-10-CM | POA: Diagnosis not present

## 2018-03-31 DIAGNOSIS — Z1389 Encounter for screening for other disorder: Secondary | ICD-10-CM | POA: Diagnosis not present

## 2018-03-31 MED FILL — SERTRALINE HCL 25 MG TABLET: 25 | 30 days supply | Qty: 30 | Fill #0

## 2018-04-13 DIAGNOSIS — Z3202 Encounter for pregnancy test, result negative: Secondary | ICD-10-CM | POA: Diagnosis not present

## 2018-04-13 DIAGNOSIS — Z30430 Encounter for insertion of intrauterine contraceptive device: Secondary | ICD-10-CM | POA: Diagnosis not present

## 2018-05-13 DIAGNOSIS — D1724 Benign lipomatous neoplasm of skin and subcutaneous tissue of left leg: Secondary | ICD-10-CM | POA: Diagnosis not present

## 2018-05-13 DIAGNOSIS — B079 Viral wart, unspecified: Secondary | ICD-10-CM | POA: Diagnosis not present

## 2018-11-11 ENCOUNTER — Other Ambulatory Visit: Payer: Self-pay

## 2018-11-11 ENCOUNTER — Ambulatory Visit (HOSPITAL_COMMUNITY)
Admission: EM | Admit: 2018-11-11 | Discharge: 2018-11-11 | Disposition: A | Discharge status: Home / Self Care | Payer: 59

## 2018-11-11 ENCOUNTER — Encounter (HOSPITAL_COMMUNITY): Payer: Self-pay

## 2018-11-11 ENCOUNTER — Emergency Department (HOSPITAL_COMMUNITY)
Admission: EM | Admit: 2018-11-11 | Discharge: 2018-11-11 | Disposition: A | Discharge status: Home / Self Care | Payer: 59 | Attending: Emergency Medicine | Admitting: Emergency Medicine

## 2018-11-11 ENCOUNTER — Emergency Department (HOSPITAL_COMMUNITY): Payer: 59

## 2018-11-11 ENCOUNTER — Ambulatory Visit (INDEPENDENT_AMBULATORY_CARE_PROVIDER_SITE_OTHER): Payer: 59

## 2018-11-11 DIAGNOSIS — R079 Chest pain, unspecified: Secondary | ICD-10-CM

## 2018-11-11 DIAGNOSIS — R0789 Other chest pain: Secondary | ICD-10-CM | POA: Diagnosis not present

## 2018-11-11 DIAGNOSIS — Z79899 Other long term (current) drug therapy: Secondary | ICD-10-CM | POA: Diagnosis not present

## 2018-11-11 DIAGNOSIS — R0602 Shortness of breath: Secondary | ICD-10-CM | POA: Diagnosis not present

## 2018-11-11 DIAGNOSIS — J3489 Other specified disorders of nose and nasal sinuses: Secondary | ICD-10-CM | POA: Diagnosis not present

## 2018-11-11 LAB — CBC WITH DIFFERENTIAL/PLATELET
Abs Immature Granulocytes: 0.01 10*3/uL (ref 0.00–0.07)
Basophils Absolute: 0 10*3/uL (ref 0.0–0.1)
Basophils Relative: 1 %
Eosinophils Absolute: 0 10*3/uL (ref 0.0–0.5)
Eosinophils Relative: 0 %
HCT: 44.6 % (ref 36.0–46.0)
Hemoglobin: 14 g/dL (ref 12.0–15.0)
Immature Granulocytes: 0 %
Lymphocytes Relative: 15 %
Lymphs Abs: 0.9 10*3/uL (ref 0.7–4.0)
MCH: 29.4 pg (ref 26.0–34.0)
MCHC: 31.4 g/dL (ref 30.0–36.0)
MCV: 93.7 fL (ref 80.0–100.0)
Monocytes Absolute: 0.4 10*3/uL (ref 0.1–1.0)
Monocytes Relative: 7 %
Neutro Abs: 4.8 10*3/uL (ref 1.7–7.7)
Neutrophils Relative %: 77 %
Platelets: 201 10*3/uL (ref 150–400)
RBC: 4.76 MIL/uL (ref 3.87–5.11)
RDW: 14 % (ref 11.5–15.5)
WBC: 6.2 10*3/uL (ref 4.0–10.5)
nRBC: 0 % (ref 0.0–0.2)

## 2018-11-11 LAB — BASIC METABOLIC PANEL
Anion gap: 14 (ref 5–15)
BUN: 12 mg/dL (ref 6–20)
CO2: 21 mmol/L — ABNORMAL LOW (ref 22–32)
Calcium: 9.4 mg/dL (ref 8.9–10.3)
Chloride: 105 mmol/L (ref 98–111)
Creatinine, Ser: 1.05 mg/dL — ABNORMAL HIGH (ref 0.44–1.00)
GFR calc Af Amer: >60 mL/min (ref >60)
GFR calc non Af Amer: >60 mL/min (ref >60)
Glucose, Bld: 105 mg/dL — ABNORMAL HIGH (ref 70–99)
Potassium: 3.9 mmol/L (ref 3.5–5.1)
Sodium: 140 mmol/L (ref 135–145)

## 2018-11-11 LAB — D-DIMER, QUANTITATIVE: D-Dimer, Quant: 0.55 ug/mL-FEU — ABNORMAL HIGH (ref 0.00–0.50)

## 2018-11-11 LAB — TROPONIN I: Troponin I: <0.03 ng/mL (ref <0.03)

## 2018-11-11 LAB — I-STAT BETA HCG BLOOD, ED (MC, WL, AP ONLY): I-stat hCG, quantitative: <5 m[IU]/mL (ref <5)

## 2018-11-11 MED ORDER — IOHEXOL 350 MG/ML SOLN
100.0000 mL | Freq: Once | INTRAVENOUS | Status: AC | PRN
  Administered 2018-11-11: 100 mL via INTRAVENOUS

## 2018-11-11 NOTE — ED Provider Notes (Signed)
[facility]    CSN: [id number] Arrival date & time: 11/11/18  7096     History   Chief Complaint Chief Complaint  Patient presents with  . Chest Pain    HPI Ashley Horton is a 33 y.o. female.   Patient is a healthy 33 year old female that presents today with stabbing centralized chest pain.  This started yesterday.  Symptoms have been constant.  The pain radiates into her upper back.  It is worse with exertion and deep breathing.  She is also had some palpitations and tachycardia.  Denies any associated fevers, cough, chest congestion, chills or body aches.  Patient was exposed to COVID-19 around 18 days ago in the [employer] where she works.  She had testing done this morning for COVID-19.  She has not gotten her results yet.  Denies any recent long distance traveling.  Denies any oral contraceptive use.  Denies any history of DVT, PE.  She does not smoke.  She does have a family history of DVTs.  ROS per HPI      Past Medical History:  Diagnosis Date  . Anemia    1st pregnancy - Fe  . Chronic kidney disease    all pregnancy dx pylonephristis - hospitalized + kidney stones  . Depression    third child took zoloft for 2-3 mos  . History of pyelonephritis during pregnancy   . Preterm labor     Patient Active Problem List   Diagnosis Date Noted  . Pregnancy 02/17/2018    Past Surgical History:  Procedure Laterality Date  . CESAREAN SECTION    . urethro dilation    . urinary reflux surgery    . WISDOM TOOTH EXTRACTION     at age 80    OB History    Gravida  5   Para  4   Term  1   Preterm  1   AB  1   Living  4     SAB  1   TAB      Ectopic      Multiple  0   Live Births  4            Home Medications    Prior to Admission medications   Medication Sig Start Date End Date Taking? Authorizing Provider  Prenatal Vit-Fe Fumarate-FA (MULTIVITAMIN-PRENATAL) 27-0.8 MG TABS tablet Take 1 tablet by mouth daily at 12 noon.   Yes  [provider]    Family History Family History  Problem Relation Age of Onset  . Vision loss Mother   . Alcohol abuse Father   . Depression Father   . Drug abuse Father   . COPD Father   . ADD / ADHD Son   . Asthma Maternal Grandmother   . Heart disease Paternal Grandmother   . Stroke Paternal Grandmother   . Cancer Paternal Grandfather   . Diabetes Paternal Grandfather   . Heart disease Paternal Grandfather   . Hyperlipidemia Paternal Grandfather   . Hypertension Paternal Grandfather     Social History Social History   Tobacco Use  . Smoking status: Never Smoker  . Smokeless tobacco: Never Used  Substance Use Topics  . Alcohol use: Not Currently    Comment: social   . Drug use: Never     Allergies   Sulfa antibiotics   Review of Systems Review of Systems   Physical Exam Triage Vital Signs ED Triage Vitals  Enc Vitals Group  BP 11/11/18 0933 134/84     Pulse Rate 11/11/18 0933 (!) 104     Resp 11/11/18 0933 17     Temp 11/11/18 0933 98.4 F (36.9 C)     Temp Source 11/11/18 0933 Oral     SpO2 11/11/18 0933 100 %     Weight 11/11/18 0931 120 lb (54.4 kg)     Height 11/11/18 0931 5\' 7"  (1.702 m)     Head Circumference --      Peak Flow --      Pain Score 11/11/18 0930 7     Pain Loc --      Pain Edu? --      Excl. in [place]? --    No data found.  Updated Vital Signs BP 134/84 (BP Location: Right Arm)   Pulse (!) 104   Temp 98.4 F (36.9 C) (Oral)   Resp 17   Ht 5\' 7"  (1.702 m)   Wt 120 lb (54.4 kg)   SpO2 100%   Breastfeeding No   BMI 18.79 kg/m   Visual Acuity Right Eye Distance:   Left Eye Distance:   Bilateral Distance:    Right Eye Near:   Left Eye Near:    Bilateral Near:     Physical Exam Vitals signs and nursing note reviewed.  Constitutional:      General: She is not in acute distress.    Appearance: She is well-developed. She is not ill-appearing, toxic-appearing or diaphoretic.  HENT:     Head:  Normocephalic and atraumatic.  Neck:     Musculoskeletal: Normal range of motion.  Cardiovascular:     Rate and Rhythm: Regular rhythm. Tachycardia present.     Heart sounds: Normal heart sounds.  Pulmonary:     Effort: Pulmonary effort is normal.     Breath sounds: Normal breath sounds.  Musculoskeletal: Normal range of motion.     Right lower leg: She exhibits no tenderness. No edema.     Left lower leg: She exhibits no tenderness. No edema.  Skin:    General: Skin is warm and dry.  Neurological:     Mental Status: She is alert.  Psychiatric:        Mood and Affect: Mood normal.      UC Treatments / Results  Labs (all labs ordered are listed, but only abnormal results are displayed) Labs Reviewed  D-DIMER, QUANTITATIVE (NOT AT [facility])    EKG None  Radiology Dg Chest 2 View  Result Date: 11/11/2018 CLINICAL DATA:  33 year old female with acute chest pain EXAM: CHEST - 2 VIEW COMPARISON:  12/08/2012 and prior radiographs FINDINGS: The cardiomediastinal silhouette is unremarkable. There is no evidence of focal airspace disease, pulmonary edema, suspicious pulmonary nodule/mass, pleural effusion, or pneumothorax. No acute bony abnormalities are identified. IMPRESSION: No active cardiopulmonary disease. Electronically Signed   By: Margarette Canada M.D.   On: 11/11/2018 10:10    Procedures Procedures (including critical care time)  Medications Ordered in UC Medications - No data to display  Initial Impression / Assessment and Plan / UC Course  I have reviewed the triage vital signs and the nursing notes.  Pertinent labs & imaging results that were available during my care of the patient were reviewed by me and considered in my medical decision making (see chart for details).     Chest pain Patient is a healthy 33 year old female with sharp stabbing central chest pain that has been constant since yesterday evening. There is some concern  for PE based on symptoms, vitals and EKG  results. Chest x-ray was normal EKG revealed tachycardia with right atrial enlargement and right axis Drew d-dimer here in clinic. Advised patient will call with any positive results. Otherwise she is non toxic or ill appearing. VSS No dyspnea or distress There is possibility of COVID-19.  She was tested this morning, 2 day tes  She was exposed approximate 18 days ago.  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   D dimer mildly elevated Pt called and given results Based on symptoms, tachycardia, abnormal EKG and positive d-dimer we will send to the ER for further evaluation management to rule out PE versus COVID. Pt understanding and agreed  She is driving there now.   Final Clinical Impressions(s) / UC Diagnoses   Final diagnoses:  Chest pain, unspecified type     Discharge Instructions     Your x ray was normal I am concerned about a blood clot I will call you when I get the D dimer results.  We will discuss a plan at that time.       ED Prescriptions    None     Controlled Substance Prescriptions [place] Controlled Substance Registry consulted? Not Applicable   Orvan July, NP 11/11/18 1144

## 2018-11-11 NOTE — ED Triage Notes (Signed)
Pt sent from UC for chest pain that feels like a stabbing pain between her shoulder blades. Reports some exertional SOB. Skin warm and dry, vitals stable.

## 2018-11-11 NOTE — ED Triage Notes (Signed)
Patient presents to Urgent Care with complaints of centralized, stabbing chest pain since yesterday. Patient reports today it is worse with exertion, significant family hx of cardiac irregularities, pt denies feeling cp in the past.

## 2018-11-11 NOTE — Discharge Instructions (Addendum)
Your x ray was normal I am concerned about a blood clot I will call you when I get the D dimer results.  We will discuss a plan at that time.

## 2018-11-11 NOTE — ED Provider Notes (Signed)
[facility] EMERGENCY DEPARTMENT Provider Note   CSN: [id number] Arrival date & time: 11/11/18  1202    History   Chief Complaint Chief Complaint  Patient presents with   Chest Pain    HPI Ashley Horton is a 33 y.o. female who presents to ED for evaluation of stabbing chest pain since yesterday.  States that the pain is intermittent and will radiate to in between her shoulder blades.  She went to work this morning" felt winded" while she was walking.  She denies history of similar symptoms in the past.  She notes that she did have a known exposure to a COVID-19 patient approximately 18 days ago.  She herself was tested at the drive-through testing this morning but was told that results could take 48 hours.  She denies any cough, fever, hemoptysis, leg swelling, history of DVT, PE, tobacco use, recent immobilization or recent travel.  She does note mild rhinorrhea which she attributes to seasonal allergies.  She has not tried any medications to help with her pain. Denies possibility of pregnancy or chronic lung disease.     HPI  Past Medical History:  Diagnosis Date   Anemia    1st pregnancy - Fe   Chronic kidney disease    all pregnancy dx pylonephristis - hospitalized + kidney stones   Depression    third child took zoloft for 2-3 mos   History of pyelonephritis during pregnancy    Preterm labor     Patient Active Problem List   Diagnosis Date Noted   Pregnancy 02/17/2018    Past Surgical History:  Procedure Laterality Date   CESAREAN SECTION     urethro dilation     urinary reflux surgery     WISDOM TOOTH EXTRACTION     at age 20     OB History    Gravida  5   Para  4   Term  1   Preterm  1   AB  1   Living  4     SAB  1   TAB      Ectopic      Multiple  0   Live Births  4            Home Medications    Prior to Admission medications   Medication Sig Start Date End Date Taking? Authorizing Provider    Prenatal Vit-Fe Fumarate-FA (MULTIVITAMIN-PRENATAL) 27-0.8 MG TABS tablet Take 1 tablet by mouth daily at 12 noon.    [provider]    Family History Family History  Problem Relation Age of Onset   Vision loss Mother    Alcohol abuse Father    Depression Father    Drug abuse Father    COPD Father    ADD / ADHD Son    Asthma Maternal Grandmother    Heart disease Paternal Grandmother    Stroke Paternal Grandmother    Cancer Paternal Grandfather    Diabetes Paternal Grandfather    Heart disease Paternal Grandfather    Hyperlipidemia Paternal Grandfather    Hypertension Paternal Grandfather     Social History Social History   Tobacco Use   Smoking status: Never Smoker   Smokeless tobacco: Never Used  Substance Use Topics   Alcohol use: Not Currently    Comment: social    Drug use: Never     Allergies   Sulfa antibiotics   Review of Systems Review of Systems  Constitutional: Negative for appetite change,  chills and fever.  HENT: Negative for ear pain, rhinorrhea, sneezing and sore throat.   Eyes: Negative for photophobia and visual disturbance.  Respiratory: Positive for shortness of breath. Negative for cough, chest tightness and wheezing.   Cardiovascular: Positive for chest pain. Negative for palpitations.  Gastrointestinal: Negative for abdominal pain, blood in stool, constipation, diarrhea, nausea and vomiting.  Genitourinary: Negative for dysuria, hematuria and urgency.  Musculoskeletal: Negative for myalgias.  Skin: Negative for rash.  Neurological: Negative for dizziness, weakness and light-headedness.     Physical Exam Updated Vital Signs BP 136/74 (BP Location: Right Arm)    Pulse (!) 105    Temp 98 F (36.7 C) (Oral)    Resp 16    SpO2 100%   Physical Exam Vitals signs and nursing note reviewed.  Constitutional:      General: She is not in acute distress.    Appearance: She is well-developed.     Comments: Speaking  in complete sentences without difficulty.  HENT:     Head: Normocephalic and atraumatic.     Nose: Nose normal.  Eyes:     General: No scleral icterus.       Left eye: No discharge.     Conjunctiva/sclera: Conjunctivae normal.  Neck:     Musculoskeletal: Normal range of motion and neck supple.  Cardiovascular:     Rate and Rhythm: Normal rate and regular rhythm.     Heart sounds: Normal heart sounds. No murmur. No friction rub. No gallop.   Pulmonary:     Effort: Pulmonary effort is normal. No respiratory distress.     Breath sounds: Normal breath sounds.  Abdominal:     General: Bowel sounds are normal. There is no distension.     Palpations: Abdomen is soft.     Tenderness: There is no abdominal tenderness. There is no guarding.  Musculoskeletal: Normal range of motion.     Comments: No lower extremity edema, erythema or calf tenderness bilaterally.  Skin:    General: Skin is warm and dry.     Findings: No rash.  Neurological:     Mental Status: She is alert.     Motor: No abnormal muscle tone.     Coordination: Coordination normal.      ED Treatments / Results  Labs (all labs ordered are listed, but only abnormal results are displayed) Labs Reviewed  BASIC METABOLIC PANEL - Abnormal; Notable for the following components:      Result Value   CO2 21 (*)    Glucose, Bld 105 (*)    Creatinine, Ser 1.05 (*)    All other components within normal limits  CBC WITH DIFFERENTIAL/PLATELET  TROPONIN I  I-STAT BETA HCG BLOOD, ED ([facility], [facility], [facility] ONLY)    EKG None  Radiology Dg Chest 2 View  Result Date: 11/11/2018 CLINICAL DATA:  33 year old female with acute chest pain EXAM: CHEST - 2 VIEW COMPARISON:  12/08/2012 and prior radiographs FINDINGS: The cardiomediastinal silhouette is unremarkable. There is no evidence of focal airspace disease, pulmonary edema, suspicious pulmonary nodule/mass, pleural effusion, or pneumothorax. No acute bony abnormalities are identified.  IMPRESSION: No active cardiopulmonary disease. Electronically Signed   By: Margarette Canada M.D.   On: 11/11/2018 10:10    Procedures Procedures (including critical care time)  Medications Ordered in ED Medications  iohexol (OMNIPAQUE) 350 MG/ML injection 100 mL (100 mLs Intravenous Contrast Given 11/11/18 1427)     Initial Impression / Assessment and Plan / ED Course  I  have reviewed the triage vital signs and the nursing notes.  Pertinent labs & imaging results that were available during my care of the patient were reviewed by me and considered in my medical decision making (see chart for details).        33 year old female presents to ED for chest pain since yesterday.  Reports some shortness of breath while at work today.  She has never experienced pain like this before.  She was exposed to a COVID positive patient about 18 days ago.  She underwent testing at a drive thru Center but her testing is pending at this time.  She denies any cough, fever or hemoptysis.  She was seen and evaluated urgent care center with negative chest x-ray but positive d-dimer.  She was sent to the ED for further evaluation.  Her tachycardia has resolved here. Lungs CTAB.  No lower extremity edema, erythema or calf tenderness that would concern me for DVT.  Will check troponin, baseline labs, obtain CT of the chest to rule out PE. EKG here shows sinus rhythm with R axis deviation.  Troponin is negative x1.  CBC, BMP unremarkable.  hCG is negative. She is low risk by HEART score. CTA pending. Care handed off to oncoming provider. Anticipate discharge home if imaging unremarkable.    Portions of this note were generated with [employer]. Dictation errors may occur despite best attempts at proofreading.   Final Clinical Impressions(s) / ED Diagnoses   Final diagnoses:  Chest wall pain    ED Discharge Orders    None       Delia Heady, PA-C 11/11/18 1433    Lennice Sites, DO 11/11/18  1606

## 2018-11-11 NOTE — ED Notes (Signed)
Patient back from CT,

## 2018-11-11 NOTE — ED Notes (Signed)
Patient verbalizes understanding of discharge instructions. Opportunity for questioning and answers were provided. Patient discharged from [facility] by provider.  

## 2018-11-11 NOTE — Discharge Instructions (Signed)
Follow-up with your primary care provider. Return to the ED if you start to have worsening symptoms, worsening shortness of breath, leg swelling, coughing up blood or trouble swallowing.

## 2018-11-13 ENCOUNTER — Encounter (HOSPITAL_COMMUNITY): Payer: Self-pay | Admitting: Family Medicine

## 2018-11-15 DIAGNOSIS — R002 Palpitations: Secondary | ICD-10-CM | POA: Diagnosis not present

## 2018-11-15 DIAGNOSIS — R079 Chest pain, unspecified: Secondary | ICD-10-CM | POA: Diagnosis not present

## 2018-11-18 MED FILL — SERTRALINE HCL 25 MG TABLET: 25 | 30 days supply | Qty: 30 | Fill #0

## 2018-11-29 MED ORDER — PROPOFOL 10 MG/ML IV BOLUS
INTRAVENOUS | Status: AC
  Filled 2018-11-29: qty 20

## 2018-11-29 MED ORDER — LIDOCAINE 2% (20 MG/ML) 5 ML SYRINGE
INTRAMUSCULAR | Status: AC
  Filled 2018-11-29: qty 5

## 2018-11-29 MED ORDER — MIDAZOLAM HCL 2 MG/2ML IJ SOLN
INTRAMUSCULAR | Status: AC
  Filled 2018-11-29: qty 2

## 2018-11-29 MED ORDER — FENTANYL CITRATE (PF) 250 MCG/5ML IJ SOLN
INTRAMUSCULAR | Status: AC
  Filled 2018-11-29: qty 5

## 2018-11-29 MED ORDER — PHENYLEPHRINE 40 MCG/ML (10ML) SYRINGE FOR IV PUSH (FOR BLOOD PRESSURE SUPPORT)
PREFILLED_SYRINGE | INTRAVENOUS | Status: AC
  Filled 2018-11-29: qty 10

## 2018-11-29 MED ORDER — SUCCINYLCHOLINE CHLORIDE 200 MG/10ML IV SOSY
PREFILLED_SYRINGE | INTRAVENOUS | Status: AC
  Filled 2018-11-29: qty 10

## 2018-11-29 MED ORDER — ONDANSETRON HCL 4 MG/2ML IJ SOLN
INTRAMUSCULAR | Status: AC
  Filled 2018-11-29: qty 2

## 2018-11-29 MED ORDER — DEXAMETHASONE SODIUM PHOSPHATE 10 MG/ML IJ SOLN
INTRAMUSCULAR | Status: AC
  Filled 2018-11-29: qty 1

## 2018-12-01 DIAGNOSIS — M79672 Pain in left foot: Secondary | ICD-10-CM | POA: Diagnosis not present

## 2019-04-03 MED FILL — INDOMETHACIN 25 MG CAPSULE: 25 | 20 days supply | Qty: 40 | Fill #0

## 2019-04-18 DIAGNOSIS — L299 Pruritus, unspecified: Secondary | ICD-10-CM | POA: Diagnosis not present

## 2019-04-18 MED FILL — predniSONE 10 MG TABS: 10 | 10 days supply | Qty: 30 | Fill #0

## 2019-05-16 ENCOUNTER — Other Ambulatory Visit: Payer: Self-pay

## 2019-05-16 ENCOUNTER — Ambulatory Visit (INDEPENDENT_AMBULATORY_CARE_PROVIDER_SITE_OTHER): Payer: 59 | Admitting: Allergy

## 2019-05-16 ENCOUNTER — Encounter: Payer: Self-pay | Admitting: Allergy

## 2019-05-16 VITALS — BP 124/82 | HR 74 | Temp 98.3°F | Resp 16 | Ht 66.5 in | Wt 123.0 lb

## 2019-05-16 DIAGNOSIS — L299 Pruritus, unspecified: Secondary | ICD-10-CM

## 2019-05-16 DIAGNOSIS — J3089 Other allergic rhinitis: Secondary | ICD-10-CM | POA: Diagnosis not present

## 2019-05-16 MED ORDER — MONTELUKAST SODIUM 10 MG PO TABS: 10.0000 mg | ORAL_TABLET | Freq: Every day | ORAL | 5 refills | Status: DC

## 2019-05-16 MED ORDER — FAMOTIDINE 20 MG PO TABS: 20.0000 mg | ORAL_TABLET | Freq: Two times a day (BID) | ORAL | 5 refills | Status: DC

## 2019-05-16 MED FILL — MONTELUKAST SOD 10 MG TAB: 10 | 30 days supply | Qty: 30 | Fill #0

## 2019-05-16 MED FILL — FAMOTIDINE 20 MG TABLET: 20 | 30 days supply | Qty: 60 | Fill #0

## 2019-05-16 NOTE — Patient Instructions (Addendum)
Pruritus   - environmental allergy skin testing is positive to weed pollen and mold.  Allergen avoidance measures provided.    - food allergy skin testing is negative.     - will obtain following labwork as well: CBC w diff, CMP, TSH/T4, alpha gal panel, urticaria panel   - recommend trial of high-dose antihistamine regimen: Pepcid 20mg  twice a day, Allegra 180mg  twice a day (take first dose to determine if makes you drowsy and if not take twice a day), and Singulair 10mg  daily at bedtime.   Singulair is not an antihistamine but a anti-leukotriene which is similar to histamine in causing symptoms like itch.  If you notice any change in mood or behavior after starting Singulair then stop this medication and let us know (symptoms revert to baseline once stopping).    - briefly discussed Xolair monthly injections if high-dose antihistamines are not effective in managing symptoms  - may try use of Sarna brand lotion that contains compounds that help "cool" the nerve endings in the skin to help manage itch  Allergic rhinitis   - environmental allergy testing as above  - avoidance measures provided  - antihistamines as above  **We are ordering labs, so please allow 1-2 weeks for the results to come back.  With the newly implemented Cures Act, the labs might be visible to you at the same time that they become visible to me.  However, I will not address the results until all of the results come  back, so please be patient.  In the meantime, continue avoiding your triggering food(s) in your After Visit Summary, including avoidance measures (if applicable), until you hear from me about the results.     Follow-up in 4-6 weeks or sooner if needed

## 2019-05-16 NOTE — Progress Notes (Signed)
New Patient Note  RE: Ashley Horton MRN: [id number] DOB: 1986-06-21 Date of Office Visit: 05/16/2019  Referring provider: Raina Mina., MD Primary care provider: Raina Mina., MD  Chief Complaint: itching  History of present illness: Ashley Horton is a 33 y.o. female presenting today for consultation for pruritus.   She has been having generalized itching for the past month.  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  She also has to wear the gowns and the lead covering as well.   She states there have been no change in the scrubbing procedure although she is using more Purel.  She states she has noted some nights that she has night sweats and has noticed some red blotches on her abdomen when she has had night sweats.  This does not occur nightly.  Denies any fevers or chills.  Denies any sick type symptoms or any preceding illnesses.  Denies any stings or bites.  Denies any medication changes or new medication starts or any new foods in the diet.  She has not changed detergents/soaps or detergents.  She does use dryer sheets and her detergent is scented with this has been what she has used for a long time.  She states she has wrapped her brain to figure out what has been triggering her itch and has not been able to identify anything.  She has not had any recent travel.  No other household members with similar symptoms. She has tried benadryl, pepcid and zyrtec (she takes this normally).   Switched zyrtec to xyzal to see if this would help.  She states that Zyrtec and Xyzal both make her drowsy.  She moisturizes with a Vaseline-based moisturizer. She has seen her PCP for the itching.  She was prescribed prednisone burst which helped however after completion of the burst the itching returned. She does state that 5 days prior to onset of the itching she had her flu vaccine which she has previously tolerated without any issues.  She  states she has had issues with hair loss as well as alternation between constipation and diarrhea since she has had her son who is now 12 months old.  She states she has had history of seasonal allergies for which she takes Zyrtec for.  She has had hives before in the past related to sulfa antibiotic use and rash related to detergent.   Denies history of asthma, eczema or food allergy.  She was noted to be dermatographic by her PCP.   Review of systems: Review of Systems  Constitutional: Positive for diaphoresis. Negative for chills, fever, malaise/fatigue and weight loss.  HENT: Negative for congestion, ear discharge, nosebleeds, sinus pain and sore throat.   Eyes: Negative for pain, discharge and redness.  Respiratory: Negative.   Cardiovascular: Negative.   Gastrointestinal: Positive for constipation and diarrhea. Negative for abdominal pain, heartburn, nausea and vomiting.  Musculoskeletal: Negative.   Skin: Positive for itching. Negative for rash.  Neurological: Negative.     All other systems negative unless noted above in HPI  Past medical history: Past Medical History:  Diagnosis Date  . Anemia    1st pregnancy - Fe  . Chronic kidney disease    all pregnancy dx pylonephristis - hospitalized + kidney stones  . Depression    third child took zoloft for 2-3 mos  . History of pyelonephritis during pregnancy   . Preterm labor  Past surgical history: Past Surgical History:  Procedure Laterality Date  . CESAREAN SECTION    . urethro dilation    . urinary reflux surgery    . WISDOM TOOTH EXTRACTION     at age 58    Family history:  Family History  Problem Relation Age of Onset  . Vision loss Mother   . Alcohol abuse Father   . Depression Father   . Drug abuse Father   . COPD Father   . ADD / ADHD Son   . Asthma Maternal Grandmother   . Heart disease Paternal Grandmother   . Stroke Paternal Grandmother   . Cancer Paternal Grandfather   . Diabetes  Paternal Grandfather   . Heart disease Paternal Grandfather   . Hyperlipidemia Paternal Grandfather   . Hypertension Paternal Grandfather     Social history: Lives in a home without carpeting with electric heating and central cooling.  1 dog in the home.  No concern for water damage, mildew or roaches in the home.  She is a registered cardiovascular invasive specialist.  Denies any smoking history.  Medication List: Current Outpatient Medications  Medication Sig Dispense Refill  . levonorgestrel (MIRENA, 52 MG,) 20 MCG/24HR IUD 1 each by Intrauterine route once.     No current facility-administered medications for this visit.     Known medication allergies: Allergies  Allergen Reactions  . Sulfa Antibiotics Hives     Physical examination: Blood pressure 124/82, pulse 74, temperature 98.3 F (36.8 C), temperature source Temporal, resp. rate 16, height 5' 6.5" (1.689 m), weight 123 lb (55.8 kg), SpO2 99 %, not currently breastfeeding.  General: Alert, interactive, in no acute distress. HEENT: PERRLA, TMs pearly gray, turbinates non-edematous without discharge, post-pharynx non erythematous. Neck: Supple without lymphadenopathy. Lungs: Clear to auscultation without wheezing, rhonchi or rales. {no increased work of breathing. CV: Normal S1, S2 without murmurs. Abdomen: Nondistended, nontender. Skin: Warm and dry, without lesions or rashes. Extremities:  No clubbing, cyanosis or edema. Neuro:   Grossly intact.  Diagnositics/Labs:  Allergy testing: Environmental allergy skin prick testing is positive to burweed marsh elder and Alternaria. Select food allergy skin prick testing is negative. Allergy testing results were read and interpreted by provider, documented by clinical staff.   Assessment and plan:   Pruritus   - environmental allergy skin testing is positive to weed pollen and mold.  Allergen avoidance measures provided.    - food allergy skin testing is negative.      - will obtain following labwork as well: CBC w diff, CMP, TSH/T4, alpha gal panel, urticaria panel   - recommend trial of high-dose antihistamine regimen: Pepcid 20mg  twice a day, Allegra 180mg  twice a day (take first dose to determine if makes you drowsy and if not take twice a day), and Singulair 10mg  daily at bedtime.   Singulair is not an antihistamine but a anti-leukotriene which is similar to histamine in causing symptoms like itch.  If you notice any change in mood or behavior after starting Singulair then stop this medication and let us know (symptoms revert to baseline once stopping).    - briefly discussed Xolair monthly injections if high-dose antihistamines are not effective in managing symptoms due to episode of   - may try use of Sarna brand lotion that contains compounds that help "cool" the nerve endings in the skin to help manage itch   Allergic rhinitis   - environmental allergy testing as above  - avoidance measures provided  -  antihistamines as above  **We are ordering labs, so please allow 1-2 weeks for the results to come back.  With the newly implemented Cures Act, the labs might be visible to you at the same time that they become visible to me.  However, I will not address the results until all of the results come  back, so please be patient.  In the meantime, continue avoiding your triggering food(s) in your After Visit Summary, including avoidance measures (if applicable), until you hear from me about the results.     Follow-up in 4-6 weeks or sooner if needed   I appreciate the opportunity to take part in Ronnette's care. Please do not hesitate to contact me with questions.  Sincerely,   Prudy Feeler, MD Allergy/Immunology [facility]

## 2019-05-24 LAB — CBC WITH DIFFERENTIAL
Basophils Absolute: 0 10*3/uL (ref 0.0–0.2)
Basos: 1 %
EOS (ABSOLUTE): 0 10*3/uL (ref 0.0–0.4)
Eos: 1 %
Hematocrit: 42.2 % (ref 34.0–46.6)
Hemoglobin: 14.1 g/dL (ref 11.1–15.9)
Immature Grans (Abs): 0 10*3/uL (ref 0.0–0.1)
Immature Granulocytes: 0 %
Lymphocytes Absolute: 1 10*3/uL (ref 0.7–3.1)
Lymphs: 21 %
MCH: 30.5 pg (ref 26.6–33.0)
MCHC: 33.4 g/dL (ref 31.5–35.7)
MCV: 91 fL (ref 79–97)
Monocytes Absolute: 0.4 10*3/uL (ref 0.1–0.9)
Monocytes: 7 %
Neutrophils Absolute: 3.5 10*3/uL (ref 1.4–7.0)
Neutrophils: 70 %
RBC: 4.62 x10E6/uL (ref 3.77–5.28)
RDW: 12.8 % (ref 11.7–15.4)
WBC: 4.9 10*3/uL (ref 3.4–10.8)

## 2019-05-24 LAB — COMPREHENSIVE METABOLIC PANEL
ALT: 10 IU/L (ref 0–32)
AST: 17 IU/L (ref 0–40)
Albumin/Globulin Ratio: 2.1 (ref 1.2–2.2)
Albumin: 4.8 g/dL (ref 3.8–4.8)
Alkaline Phosphatase: 61 IU/L (ref 39–117)
BUN/Creatinine Ratio: 15 (ref 9–23)
BUN: 12 mg/dL (ref 6–20)
Bilirubin Total: 1.3 mg/dL — ABNORMAL HIGH (ref 0.0–1.2)
CO2: 26 mmol/L (ref 20–29)
Calcium: 9.5 mg/dL (ref 8.7–10.2)
Chloride: 101 mmol/L (ref 96–106)
Creatinine, Ser: 0.78 mg/dL (ref 0.57–1.00)
GFR calc Af Amer: 114 mL/min/{1.73_m2} (ref >59)
GFR calc non Af Amer: 99 mL/min/{1.73_m2} (ref >59)
Globulin, Total: 2.3 g/dL (ref 1.5–4.5)
Glucose: 94 mg/dL (ref 65–99)
Potassium: 4 mmol/L (ref 3.5–5.2)
Sodium: 140 mmol/L (ref 134–144)
Total Protein: 7.1 g/dL (ref 6.0–8.5)

## 2019-05-24 LAB — ALPHA-GAL PANEL
Alpha Gal IgE*: <0.1 kU/L (ref <0.10)
Beef (Bos spp) IgE: <0.1 kU/L (ref <0.35)
Class Interpretation: 0
Class Interpretation: 0
Class Interpretation: 0
Lamb/Mutton (Ovis spp) IgE: <0.1 kU/L (ref <0.35)
Pork (Sus spp) IgE: <0.1 kU/L (ref <0.35)

## 2019-05-24 LAB — THYROID ANTIBODIES
Thyroglobulin Antibody: <1 IU/mL (ref 0.0–0.9)
Thyroperoxidase Ab SerPl-aCnc: <9 IU/mL (ref 0–34)

## 2019-05-24 LAB — TSH+FREE T4
Free T4: 1.22 ng/dL (ref 0.82–1.77)
TSH: 2.01 u[IU]/mL (ref 0.450–4.500)

## 2019-05-24 LAB — TRYPTASE: Tryptase: 4.1 ug/L (ref 2.2–13.2)

## 2019-05-24 LAB — CHRONIC URTICARIA: cu index: 5.2 (ref <10)

## 2019-06-05 MED FILL — INDOMETHACIN 25 MG CAPSULE: 25 | 20 days supply | Qty: 40 | Fill #0

## 2019-06-20 ENCOUNTER — Ambulatory Visit: Payer: 59 | Admitting: Allergy

## 2019-07-12 DIAGNOSIS — Z20822 Contact with and (suspected) exposure to covid-19: Secondary | ICD-10-CM | POA: Diagnosis not present

## 2019-07-12 DIAGNOSIS — J029 Acute pharyngitis, unspecified: Secondary | ICD-10-CM | POA: Diagnosis not present

## 2019-07-17 MED FILL — SUCRALFATE 1 GM/10ML SUSP: 1 | 7 days supply | Qty: 280 | Fill #0

## 2019-07-17 MED FILL — PANTOPRAZOLE SOD DR 40 MG T: 40 | 30 days supply | Qty: 30 | Fill #0

## 2019-09-22 IMAGING — CT CT ANGIOGRAPHY CHEST
2 of 7 series · 19 of 46 positions shown · IV contrast (omnipaque)
Comparison: 02/09/2011

CLINICAL DATA: Centralized chest pain for 2 days.

EXAM:
CT ANGIOGRAPHY CHEST WITH CONTRAST
TECHNIQUE: Multidetector CT imaging of the chest was performed using the
standard protocol during bolus administration of intravenous
contrast. Multiplanar CT image reconstructions and MIPs were
obtained to evaluate the vascular anatomy.
CONTRAST:  100mL OMNIPAQUE IOHEXOL 350 MG/ML SOLN

[Series 7: thins · axial · 0.58mm/px · z∈[+1085,+1365]mm · 16 of 450 slices shown]
[im 25/450  lung]
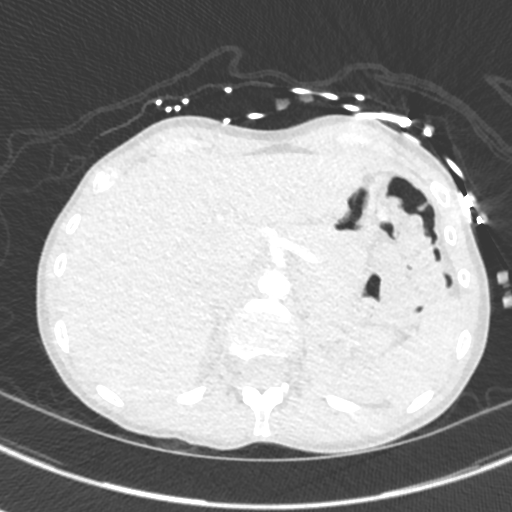
[im 50/450  soft-tissue]
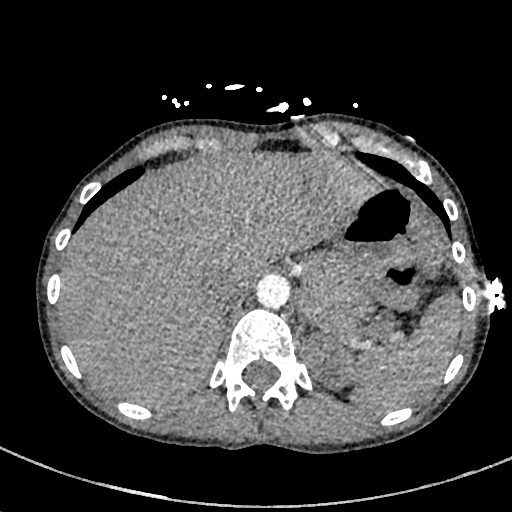
[im 75/450  lung]
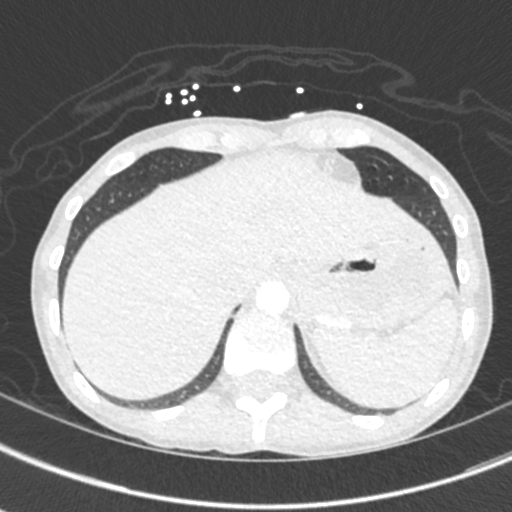
[im 100/450  soft-tissue]
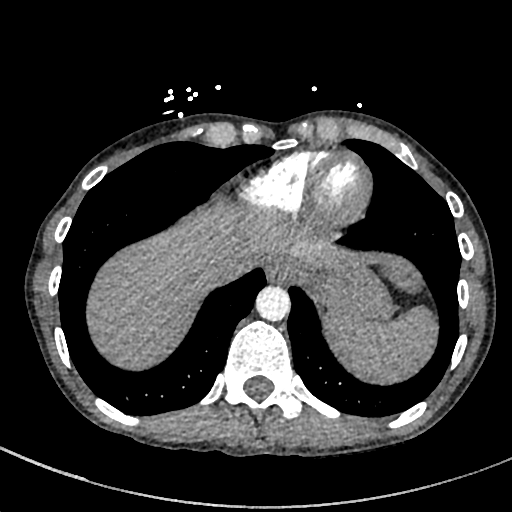
[im 125/450  lung]
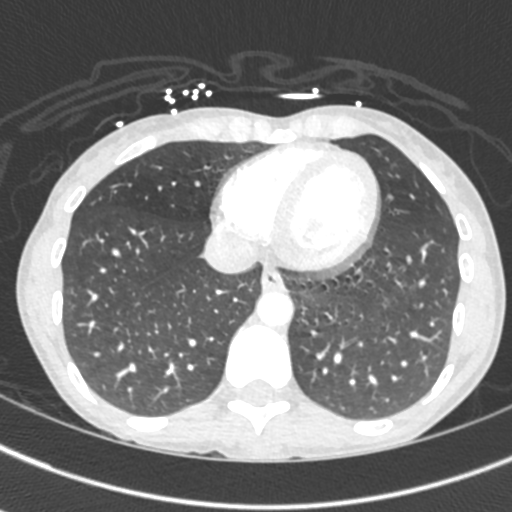
[im 150/450  soft-tissue]
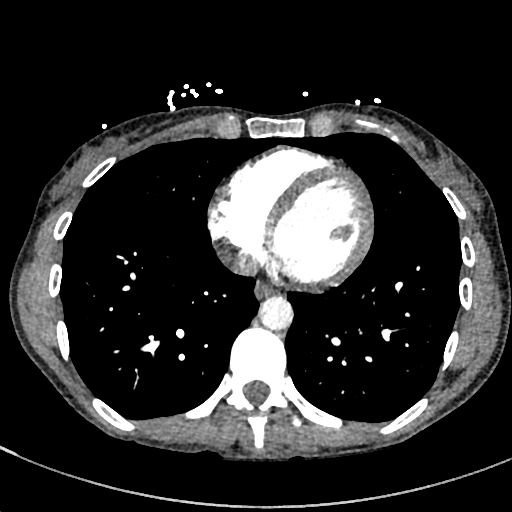
[im 175/450  lung]
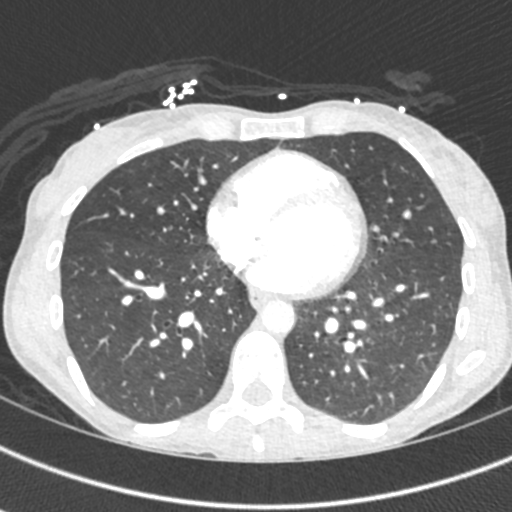
[im 200/450  soft-tissue]
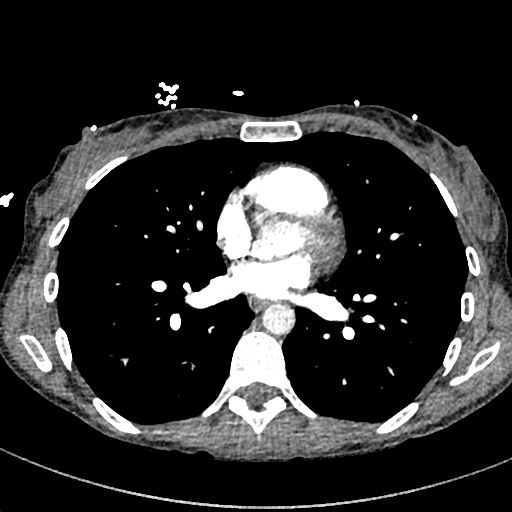
[im 250/450  lung]
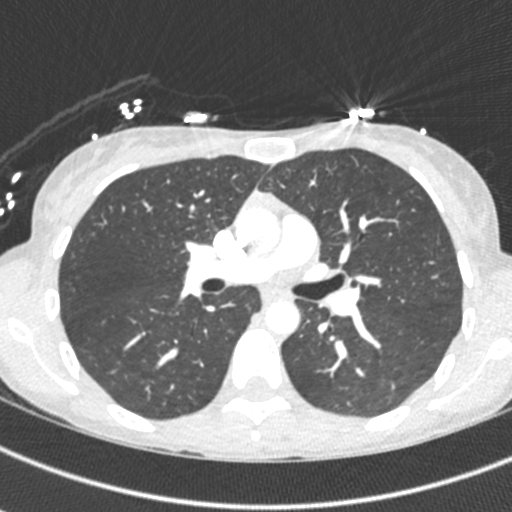
[im 275/450  soft-tissue]
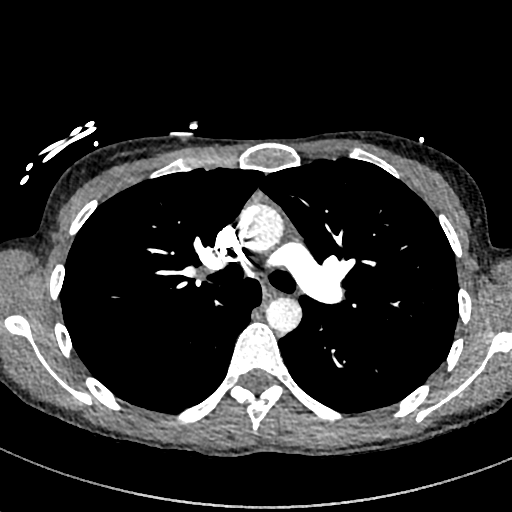
[im 300/450  lung]
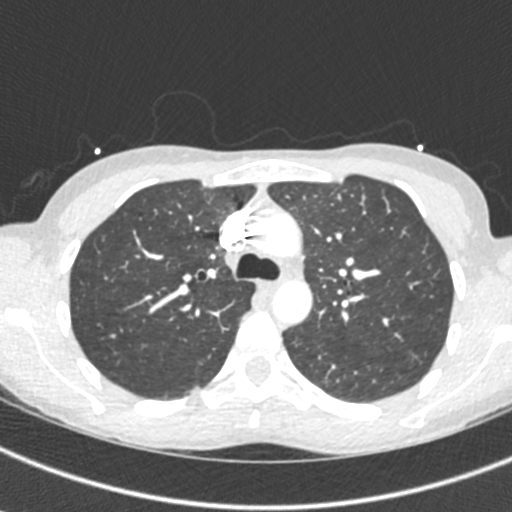
[im 325/450  soft-tissue]
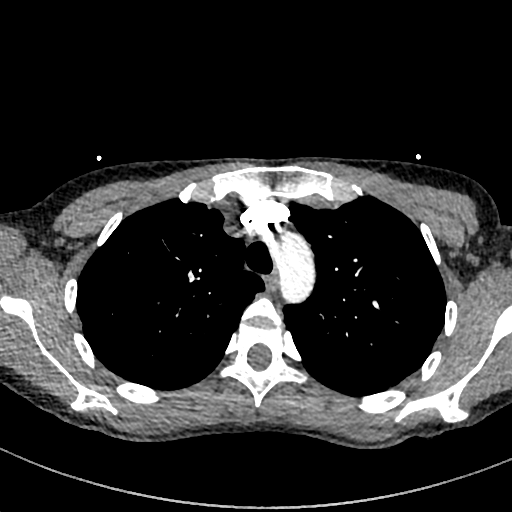
[im 350/450  lung]
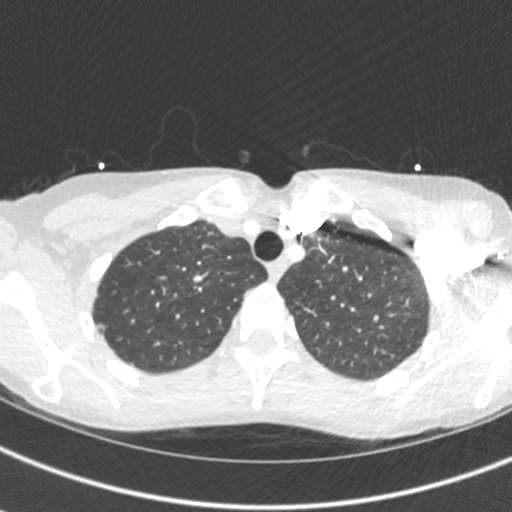
[im 375/450  soft-tissue]
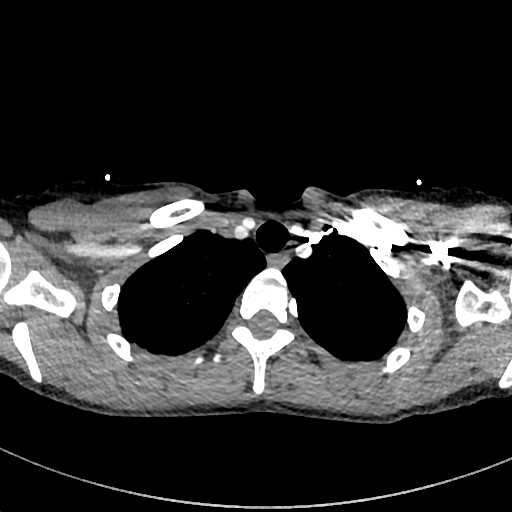
[im 400/450  lung]
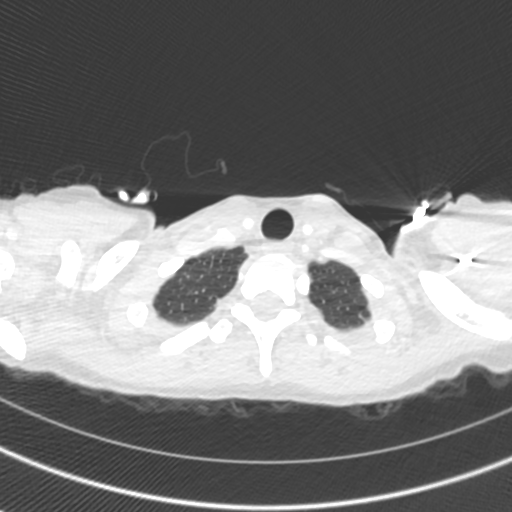
[im 425/450  soft-tissue]
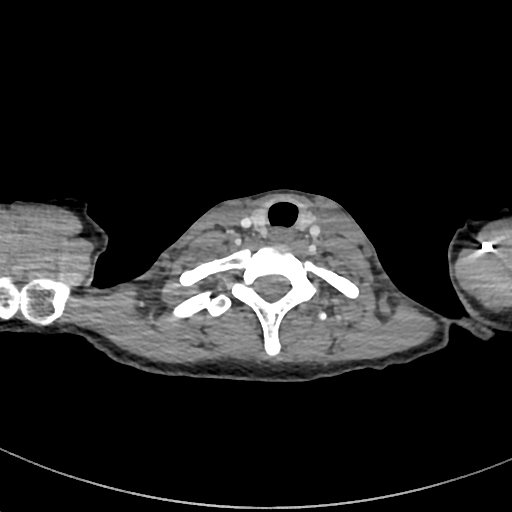

[Series 8: cor · coronal · 0.55mm/px · 3 of 137 slices shown]
[im 35/137  soft-tissue]
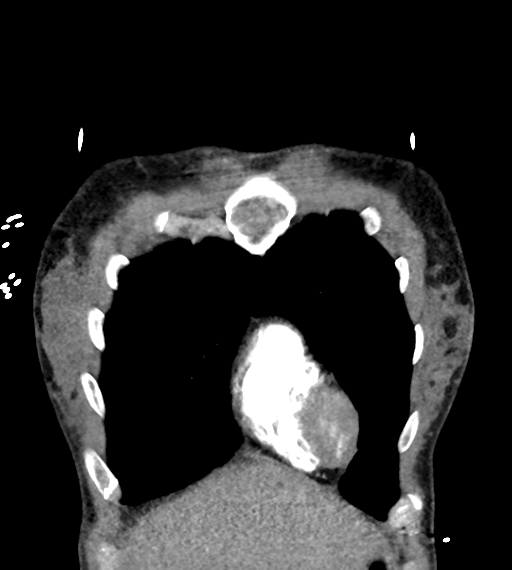
[im 69/137  soft-tissue]
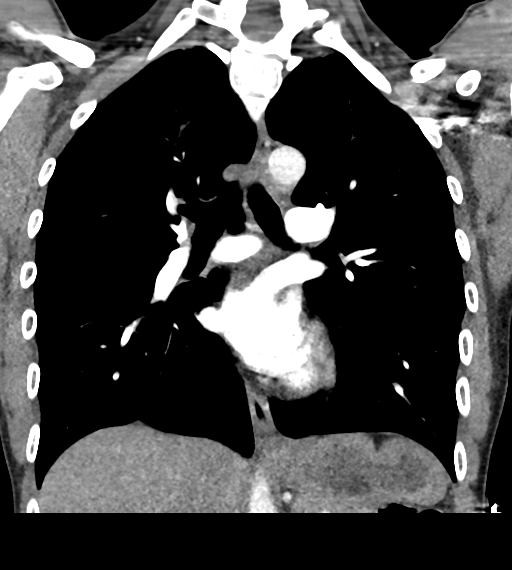
[im 103/137  soft-tissue]
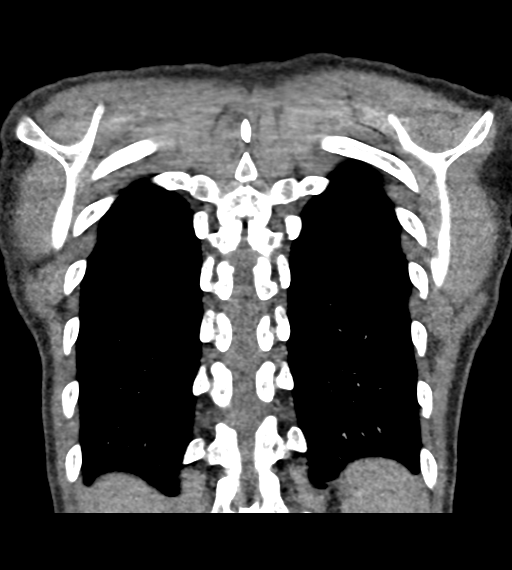

[19 of 46 positions shown; findings below may reference images not displayed]

FINDINGS: Cardiovascular: The heart size is normal. No substantial pericardial
effusion. No thoracic aortic aneurysm. No filling defect within the
opacified pulmonary arteries to suggest the presence of an acute
pulmonary embolus.

Mediastinum/Nodes: No mediastinal lymphadenopathy. There is no hilar
lymphadenopathy. The esophagus has normal imaging features. There is
no axillary lymphadenopathy.

Lungs/Pleura: The central tracheobronchial airways are patent. No
focal airspace consolidation. No edema or pleural effusion. No
suspicious pulmonary nodule or mass.

Upper Abdomen: Unremarkable.

Musculoskeletal: No worrisome lytic or sclerotic osseous
abnormality.

Review of the MIP images confirms the above findings.
IMPRESSION: Unremarkable CTA chest. Specifically, no evidence for acute
pulmonary embolus. No findings to explain the patient's history of
chest pain.

## 2019-11-21 DIAGNOSIS — J029 Acute pharyngitis, unspecified: Secondary | ICD-10-CM | POA: Diagnosis not present

## 2019-11-21 DIAGNOSIS — J0100 Acute maxillary sinusitis, unspecified: Secondary | ICD-10-CM | POA: Diagnosis not present

## 2019-11-21 DIAGNOSIS — Z20822 Contact with and (suspected) exposure to covid-19: Secondary | ICD-10-CM | POA: Diagnosis not present

## 2020-02-27 DIAGNOSIS — H52223 Regular astigmatism, bilateral: Secondary | ICD-10-CM | POA: Diagnosis not present

## 2020-02-27 DIAGNOSIS — H5213 Myopia, bilateral: Secondary | ICD-10-CM | POA: Diagnosis not present

## 2020-03-12 ENCOUNTER — Ambulatory Visit: Payer: 59 | Attending: Internal Medicine

## 2020-03-12 DIAGNOSIS — Z23 Encounter for immunization: Secondary | ICD-10-CM

## 2020-03-12 NOTE — Progress Notes (Signed)
   Covid-19 Vaccination Clinic  Name:  Ashley Horton    MRN: [id number] DOB: 08/06/1985  03/12/2020  [name] was observed post Covid-19 immunization for 15 minutes without incident. She was provided with Vaccine Information Sheet and instruction to access the V-Safe system.   [name] was instructed to call 911 with any severe reactions post vaccine: Marland Kitchen Difficulty breathing  . Swelling of face and throat  . A fast heartbeat  . A bad rash all over body  . Dizziness and weakness   Immunizations Administered    Name Date Dose VIS Date Route   Pfizer COVID-19 Vaccine 03/12/2020  4:01 PM 0.3 mL 08/30/2018 Intramuscular   Manufacturer: [place]   Lot: [id number]   [place]: [id number]

## 2020-03-19 ENCOUNTER — Ambulatory Visit: Payer: 59

## 2020-04-02 ENCOUNTER — Ambulatory Visit: Payer: 59 | Attending: Internal Medicine

## 2020-04-02 DIAGNOSIS — Z23 Encounter for immunization: Secondary | ICD-10-CM

## 2020-04-02 NOTE — Progress Notes (Signed)
° °  Covid-19 Vaccination Clinic  Name:  Varnell Donate    MRN: [id number] DOB: Jun 02, 1986  04/02/2020  Ms. Didio was observed post Covid-19 immunization for 15 minutes without incident. She was provided with Vaccine Information Sheet and instruction to access the V-Safe system.   Ms. Riedesel was instructed to call 911 with any severe reactions post vaccine:  Difficulty breathing   Swelling of face and throat   A fast heartbeat   A bad rash all over body   Dizziness and weakness   Immunizations Administered    Name Date Dose VIS Date Route   Pfizer COVID-19 Vaccine 04/02/2020  3:38 PM 0.3 mL 08/30/2018 Intramuscular   Manufacturer: [place]   Lot: [id number]   [place]: [id number]

## 2020-04-17 DIAGNOSIS — R519 Headache, unspecified: Secondary | ICD-10-CM | POA: Diagnosis not present

## 2020-04-17 DIAGNOSIS — Z20822 Contact with and (suspected) exposure to covid-19: Secondary | ICD-10-CM | POA: Diagnosis not present

## 2020-04-18 ENCOUNTER — Other Ambulatory Visit: Payer: Self-pay

## 2020-04-18 ENCOUNTER — Ambulatory Visit (HOSPITAL_COMMUNITY)
Admission: RE | Admit: 2020-04-18 | Discharge: 2020-04-18 | Disposition: A | Discharge status: Home / Self Care | Payer: 59 | Source: Ambulatory Visit | Attending: Specialist | Admitting: Specialist

## 2020-04-18 ENCOUNTER — Other Ambulatory Visit (HOSPITAL_COMMUNITY): Payer: Self-pay | Admitting: Specialist

## 2020-04-18 ENCOUNTER — Other Ambulatory Visit: Payer: Self-pay | Admitting: Specialist

## 2020-04-18 DIAGNOSIS — G4452 New daily persistent headache (NDPH): Secondary | ICD-10-CM | POA: Insufficient documentation

## 2020-04-18 DIAGNOSIS — G9389 Other specified disorders of brain: Secondary | ICD-10-CM | POA: Diagnosis not present

## 2020-04-18 DIAGNOSIS — R519 Headache, unspecified: Secondary | ICD-10-CM | POA: Diagnosis not present

## 2020-04-18 DIAGNOSIS — R42 Dizziness and giddiness: Secondary | ICD-10-CM | POA: Diagnosis not present

## 2020-04-18 DIAGNOSIS — H748X2 Other specified disorders of left middle ear and mastoid: Secondary | ICD-10-CM | POA: Diagnosis not present

## 2020-04-18 MED ORDER — GADOBUTROL 1 MMOL/ML IV SOLN
5.5000 mL | Freq: Once | INTRAVENOUS | Status: AC | PRN
  Administered 2020-04-18: 5.5 mL via INTRAVENOUS

## 2020-04-19 ENCOUNTER — Ambulatory Visit (HOSPITAL_COMMUNITY): Payer: 59

## 2020-04-26 ENCOUNTER — Encounter: Payer: Self-pay | Admitting: Neurology

## 2020-06-14 NOTE — Progress Notes (Addendum)
NEUROLOGY FOLLOW UP OFFICE NOTE  Ashley Horton [id number]   Subjective:  Ashley Horton is a 34 year old right-handed female with CKD and history of pyelonephritis with pregnancy presents for headaches.  History supplemented by referring provider's note.  Started having headache, back of left side aching thought it may have been shingles.  Next day dizzy  Prednisone helped.    When goes to bed hurting and worse in morning.  Felt something deep in ear.  No neuralgia or shooting.  No neck pain.    For the past year headache.  Holocephalic pressure throbbing severe.  Coffee ibuprofen and dulls for rest of the day.  3 to 5 times a week.  5-6 days a week. wometimes nausea, photo phono    Eye exam a few months ago.    2005-2006 fireworks in eye - not in years and now again corner of eye and moves across, right to left split second a few times a day.  Since October at least once a day. Dizziness, tingling in extremities, LP.    She started having headaches around 2005-2006.  Headaches are moderate to severe holocephalic pressure-like and throbbing pain.  They tend to be more severe in the morning upon waking up.  There is associated nausea, photophobia and phonophobia.  They last several hours.  Around the same time, she started experiencing brief episodes of visual disturbance described as a flash of light in the right side of her visual field of both eyes that travels across the left side.  It lasts only seconds.  She also had some numbness and tingling as well as dizziness at the time.  She saw a neurologist in 2007.  MRI of brain at that time showed mild nonspecific white matter changes.  She did undergo a lumbar puncture which was reportedly negative.    .  In 2021, her headaches gradually became more frequent, to where she had a daily dull headache but had a severe headache 3 to 5 days a week.  She would wake up with a severe headache with nausea, drink a cup of coffee which would dull the  headache that would last for several hours.  She would treat with ibuprofen, which she took 5 to 6 days a week.  In early October 2021, she had a new severe headache, described as an aching in the left occipital region as well as a sensation of something in her left ear.  No neuralgia or paresthesias involving the scalp.  No rash.  No neck pain.  She is vaccinated for Covid and rapid Covid test with follow up PCR were negative.  Sed rate was 3.  In case she was having shingles, she was prescribed Valtrex and prednisone taper, which aborted the headache.Marland Kitchen  MRI of brain with and without contrast on 04/18/2020 personally reviewed showed nonspecific cerebral white matter changes progressed since prior imaging in 2007, without abnormal enhancement and no acute intracranial abnormalities.  Small signal abnormality seen in right medullary pyramid but as it was only apparent on FLAIR, probably artifact.  She has history of neck pain with radicular pain and numbness in the arms.  MRI cervical spine 08/16/13 small disc protrusion C5-6 left C6 no cord  Current NSAIDS/analgesics:  ibuprofen Current triptans:  none Current ergotamine:  none Current anti-emetic:  none Current muscle relaxants:  none Current Antihypertensive medications:  none Current Antidepressant medications:  none Current Anticonvulsant medications:  none Current anti-CGRP:  none Current Vitamins/Herbal/Supplements:  none Current Antihistamines/Decongestants:  none Other therapy:  none Hormone/birth control:  Mirena   Past NSAIDS/analgesics:  Tramadol (ineffective, caused itching) Past abortive triptans:  sumatriptan Past abortive ergotamine:  none Past muscle relaxants:  none Past anti-emetic:  none Past antihypertensive medications:  none Past antidepressant medications:  Amitriptyline   Past anticonvulsant medications:  none Past anti-CGRP:  none Past vitamins/Herbal/Supplements:  none  Family history:  Paternal aunt (multiple  sclerosis).  No known family history of migraine.   PAST MEDICAL HISTORY: Past Medical History:  Diagnosis Date  . Anemia    1st pregnancy - Fe  . Chronic kidney disease    all pregnancy dx pylonephristis - hospitalized + kidney stones  . Depression    third child took zoloft for 2-3 mos  . History of pyelonephritis during pregnancy   . Preterm labor     MEDICATIONS: Current Outpatient Medications on File Prior to Visit  Medication Sig Dispense Refill  . famotidine (PEPCID) 20 MG tablet Take 1 tablet (20 mg total) by mouth 2 (two) times daily. 60 tablet 5  . levonorgestrel (MIRENA, 52 MG,) 20 MCG/24HR IUD 1 each by Intrauterine route once.    . montelukast (SINGULAIR) 10 MG tablet Take 1 tablet (10 mg total) by mouth at bedtime. 30 tablet 5   No current facility-administered medications on file prior to visit.    ALLERGIES: Allergies  Allergen Reactions  . Sulfa Antibiotics Hives    FAMILY HISTORY: Family History  Problem Relation Age of Onset  . Vision loss Mother   . Alcohol abuse Father   . Depression Father   . Drug abuse Father   . COPD Father   . ADD / ADHD Son   . Asthma Maternal Grandmother   . Heart disease Paternal Grandmother   . Stroke Paternal Grandmother   . Cancer Paternal Grandfather   . Diabetes Paternal Grandfather   . Heart disease Paternal Grandfather   . Hyperlipidemia Paternal Grandfather   . Hypertension Paternal Grandfather     SOCIAL HISTORY: Social History   Socioeconomic History  . Marital status: Married    Spouse name: Not on file  . Number of children: 3  . Years of education: Not on file  . Highest education level: Not on file  Occupational History  . Not on file  Tobacco Use  . Smoking status: Never Smoker  . Smokeless tobacco: Never Used  Vaping Use  . Vaping Use: Never used  Substance and Sexual Activity  . Alcohol use: Not Currently    Comment: social   . Drug use: Never  . Sexual activity: Yes    Birth  control/protection: None    Comment: vasectomy  Other Topics Concern  . Not on file  Social History Narrative  . Not on file   Social Determinants of Health   Financial Resource Strain: Not on file  Food Insecurity: Not on file  Transportation Needs: Not on file  Physical Activity: Not on file  Stress: Not on file  Social Connections: Not on file  Intimate Partner Violence: Not on file     Objective:  Blood pressure (!) 144/80, pulse 74, height 5\' 7"  (1.702 m), weight 120 lb 9.6 oz (54.7 kg), SpO2 100 %. General: No acute distress.  Patient appears well-groomed.   Head:  Normocephalic/atraumatic Eyes:  Fundi examined but not visualized Neck: supple, no paraspinal tenderness, full range of motion Heart:  Regular rate and rhythm Lungs:  Clear to auscultation bilaterally Back: No paraspinal tenderness Neurological Exam: alert  and oriented to person, place, and time. Attention span and concentration intact, recent and remote memory intact, fund of knowledge intact.  Speech fluent and not dysarthric, language intact.  CN II-XII intact. Bulk and tone normal, muscle strength 5/5 throughout.  Sensation to light touch, temperature and vibration intact.  Deep tendon reflexes 2+ throughout, toes downgoing.  Finger to nose and heel to shin testing intact.  Gait normal, Romberg negative.   Assessment/Plan:   1.  Chronic migraine without aura, without status migrainosus, intractable 2.  Visual disturbance, may be ocular migraine however its short duration (seconds) is unusual for visual migraine aura (usually 10 to 15 minutes). 3.  Abnormal brain MRI.  Findings are nonspecific.  Reportedly progressed compared to prior MRI from 2007 (which I do not have access to for review).  Findings may be attributed to migraines.  MRI of cervical spine from 2015 showed no cord abnormalities.  Lumbar puncture reportedly normal.  At this time, she exhibits no clinical neurological symptoms other than the  headache, so we will hold off on repeating a lumbar puncture.   1.  Migraine prevention:  Start Aimovig 140mg  every 28 days 2.  Migraine rescue:  Will have her try Nurtec with Zofran ODT 4mg  3.  Limit use of pain relievers to no more than 2 days out of week to prevent risk of rebound or medication-overuse headache. 4.  Keep headache diary 5.  Follow up 4 to 6 months.  Metta Clines, DO  CC:  Gilford Rile, MD  Edyth Gunnels, NP

## 2020-06-17 ENCOUNTER — Ambulatory Visit (INDEPENDENT_AMBULATORY_CARE_PROVIDER_SITE_OTHER): Payer: 59 | Admitting: Neurology

## 2020-06-17 ENCOUNTER — Other Ambulatory Visit (HOSPITAL_COMMUNITY): Payer: Self-pay | Admitting: Neurology

## 2020-06-17 ENCOUNTER — Encounter: Payer: Self-pay | Admitting: Neurology

## 2020-06-17 ENCOUNTER — Other Ambulatory Visit: Payer: Self-pay

## 2020-06-17 VITALS — BP 144/80 | HR 74 | Ht 67.0 in | Wt 120.6 lb

## 2020-06-17 DIAGNOSIS — H539 Unspecified visual disturbance: Secondary | ICD-10-CM

## 2020-06-17 DIAGNOSIS — R9082 White matter disease, unspecified: Secondary | ICD-10-CM

## 2020-06-17 DIAGNOSIS — G43719 Chronic migraine without aura, intractable, without status migrainosus: Secondary | ICD-10-CM

## 2020-06-17 MED ORDER — AIMOVIG 140 MG/ML ~~LOC~~ SOAJ: 140.0000 mg | SUBCUTANEOUS | 5 refills | Status: DC

## 2020-06-17 MED ORDER — ONDANSETRON 4 MG PO TBDP: 4.0000 mg | ORAL_TABLET | Freq: Three times a day (TID) | ORAL | 5 refills | Status: DC | PRN

## 2020-06-17 MED FILL — AIMOVIG 140 MG/ML SOAJ: 140 | 28 days supply | Qty: 1 | Fill #0

## 2020-06-17 MED FILL — ONDANSETRON ODT 4 MG TABLET: 4 | 7 days supply | Qty: 20 | Fill #0

## 2020-06-17 NOTE — Progress Notes (Signed)
Ashley Horton ([facility]) Rx #: [id number] Aimovig 140MG /ML auto-injectors   Form MedImpact ePA Form 2017 NCPDP Created 24 minutes ago Sent to Plan 10 minutes ago Plan Response 10 minutes ago Submit Clinical Questions 8 minutes ago Determination Favorable 7 minutes ago Message from Plan The request has been approved. The authorization is effective for a maximum of 6 fills from 06/17/2020 to 12/15/2020, as long as the member is enrolled in their current health plan. This has been approved for a quantity limit of 1.0 with a day supply limit of 30.0. A written notification letter will follow with additional details.

## 2020-06-17 NOTE — Patient Instructions (Signed)
  1. Start Aimovig 140mg  injection every 28 days 2. Take Nurtec 75mg  at earliest onset of headache.  Maximum 1 tablet in 24 hours.  If effective, contact me for prescription 3. Ondansetron ODT for nausea 4. Limit use of pain relievers to no more than 2 days out of the week.  These medications include acetaminophen, NSAIDs (ibuprofen/Advil/Motrin, naproxen/Aleve, triptans (Imitrex/sumatriptan), Excedrin, and narcotics.  This will help reduce risk of rebound headaches. 5. Be aware of common food triggers:  - Caffeine:  coffee, black tea, cola, Mt. Dew  - Chocolate  - Dairy:  aged cheeses (brie, blue, cheddar, gouda, [place], provolone, [place], Swiss, etc), chocolate milk, buttermilk, sour cream, limit eggs and yogurt  - Nuts, peanut butter  - Alcohol  - Cereals/grains:  FRESH breads (fresh bagels, sourdough, doughnuts), yeast productions  - Processed/canned/aged/cured meats (pre-packaged deli meats, hotdogs)  - MSG/glutamate:  soy sauce, flavor enhancer, pickled/preserved/marinated foods  - Sweeteners:  aspartame (Equal, Nutrasweet).  Sugar and Splenda are okay  - Vegetables:  legumes (lima beans, lentils, snow peas, fava beans, pinto peans, peas, garbanzo beans), sauerkraut, onions, olives, pickles  - Fruit:  avocados, bananas, citrus fruit (orange, lemon, grapefruit), mango  - Other:  Frozen meals, macaroni and cheese 6. Routine exercise 7. Stay adequately hydrated (aim for 64 oz water daily) 8. Keep headache diary 9. Maintain proper stress management 10. Maintain proper sleep hygiene 11. Do not skip meals 12. Consider supplements:  magnesium citrate 400mg  daily, riboflavin 400mg  daily, coenzyme Q10 100mg  three times daily.

## 2020-07-04 ENCOUNTER — Telehealth: Payer: Self-pay | Admitting: Neurology

## 2020-07-04 NOTE — Telephone Encounter (Signed)
Patient called to report that the pain that brought her to Dr Everlena Cooper to start with is starting again. She tried the rescue med and it only helped her a little. She would like to try it again, could she get another sample?  Please call.

## 2020-07-04 NOTE — Telephone Encounter (Signed)
OK to come by and pick up another sample.

## 2020-07-04 NOTE — Telephone Encounter (Signed)
Sample left at front for patient.

## 2020-07-04 NOTE — Telephone Encounter (Signed)
Nurtec is what was given.

## 2020-07-04 NOTE — Telephone Encounter (Signed)
No answer at [phone]

## 2020-07-09 ENCOUNTER — Ambulatory Visit: Payer: 59 | Admitting: Neurology

## 2020-07-31 DIAGNOSIS — F411 Generalized anxiety disorder: Secondary | ICD-10-CM | POA: Diagnosis not present

## 2020-07-31 DIAGNOSIS — U071 COVID-19: Secondary | ICD-10-CM | POA: Diagnosis not present

## 2020-07-31 DIAGNOSIS — R079 Chest pain, unspecified: Secondary | ICD-10-CM | POA: Diagnosis not present

## 2020-07-31 DIAGNOSIS — G4452 New daily persistent headache (NDPH): Secondary | ICD-10-CM | POA: Diagnosis not present

## 2020-08-02 DIAGNOSIS — R7989 Other specified abnormal findings of blood chemistry: Secondary | ICD-10-CM | POA: Diagnosis not present

## 2020-08-02 DIAGNOSIS — U071 COVID-19: Secondary | ICD-10-CM | POA: Diagnosis not present

## 2020-08-02 DIAGNOSIS — R918 Other nonspecific abnormal finding of lung field: Secondary | ICD-10-CM | POA: Diagnosis not present

## 2020-08-02 DIAGNOSIS — R079 Chest pain, unspecified: Secondary | ICD-10-CM | POA: Diagnosis not present

## 2020-08-06 DIAGNOSIS — R079 Chest pain, unspecified: Secondary | ICD-10-CM | POA: Diagnosis not present

## 2020-08-23 ENCOUNTER — Other Ambulatory Visit (HOSPITAL_COMMUNITY): Payer: Self-pay | Admitting: Internal Medicine

## 2020-09-06 MED FILL — SERTRALINE HCL 25 MG TABLET: 25 | 90 days supply | Qty: 90 | Fill #0

## 2020-09-24 DIAGNOSIS — L700 Acne vulgaris: Secondary | ICD-10-CM | POA: Diagnosis not present

## 2020-09-26 MED FILL — AIMOVIG 140 MG/ML SOAJ: 140 | 28 days supply | Qty: 1 | Fill #0

## 2020-10-09 ENCOUNTER — Other Ambulatory Visit (HOSPITAL_COMMUNITY): Payer: Self-pay

## 2020-10-09 MED ORDER — BUPROPION HCL ER (XL) 150 MG PO TB24
1.0000 | ORAL_TABLET | Freq: Every day | ORAL | 3 refills | Status: DC
  Filled 2020-10-09: qty 30, 30d supply, fill #0

## 2020-10-22 ENCOUNTER — Other Ambulatory Visit (HOSPITAL_COMMUNITY): Payer: Self-pay

## 2020-12-04 ENCOUNTER — Other Ambulatory Visit (HOSPITAL_COMMUNITY): Payer: Self-pay

## 2020-12-04 MED FILL — Erenumab-aooe Subcutaneous Soln Auto-Injector 140 MG/ML: SUBCUTANEOUS | 28 days supply | Qty: 1 | Fill #0 | Status: AC

## 2020-12-10 ENCOUNTER — Other Ambulatory Visit (HOSPITAL_COMMUNITY): Payer: Self-pay

## 2020-12-10 NOTE — Progress Notes (Signed)
Ashley Horton (Key: [id number]) Aimovig 140MG /ML auto-injectors   Form MedImpact ePA Form 2017 NCPDP Created 9 days ago Sent to Plan 10 minutes ago Plan Response 9 minutes ago Submit Clinical Questions less than a minute ago Determination Wait for Determination Please wait for MedImpact 2017 to return a determination.

## 2020-12-13 NOTE — Progress Notes (Signed)
Aimovig 140mg /ml injection denied. The insurance stated unable to authorize due to not meeting criteria rules.

## 2020-12-13 NOTE — Progress Notes (Signed)
Left message for patient to contact office.

## 2021-03-20 ENCOUNTER — Other Ambulatory Visit (HOSPITAL_COMMUNITY): Payer: Self-pay

## 2021-03-20 ENCOUNTER — Telehealth: Payer: Self-pay | Admitting: Neurology

## 2021-03-20 MED FILL — Erenumab-aooe Subcutaneous Soln Auto-Injector 140 MG/ML: SUBCUTANEOUS | 28 days supply | Qty: 1 | Fill #1 | Status: CN

## 2021-03-20 NOTE — Telephone Encounter (Signed)
LMOVM for pt, Pt due for a f/u with Dr.Jaffe, and to clarify with Medication needed a PA. Also pt may need refill on medication on Aimovig 140 with 5 refills sent 06/2020, Pt should not have any refills left.

## 2021-03-21 ENCOUNTER — Other Ambulatory Visit (HOSPITAL_COMMUNITY): Payer: Self-pay

## 2021-03-21 MED FILL — Erenumab-aooe Subcutaneous Soln Auto-Injector 140 MG/ML: SUBCUTANEOUS | 28 days supply | Qty: 1 | Fill #1 | Status: AC

## 2021-03-21 NOTE — Telephone Encounter (Signed)
F/u   Ashley Horton (Key: [id number]) Aimovig '140MG'$ /ML auto-injectors   Form MedImpact ePA Form 2017 NCPDP Created 21 hours ago Sent to Plan 21 hours ago Plan Response 21 hours ago Submit Clinical Questions less than a minute ago Determination Wait for Determination Please wait for MedImpact 2017 to return a determination.

## 2021-03-21 NOTE — Telephone Encounter (Signed)
F/u   Message from Plan The request has been approved.   The authorization is effective for a maximum of 6 fills from 03/21/2021 to 09/17/2021, as long as the member is enrolled in their current health plan.   The request was approved with a quantity restriction. This has been approved for a quantity limit of 1 with a day supply limit of 30. A written notification letter will follow with additional details.

## 2021-03-25 ENCOUNTER — Other Ambulatory Visit (HOSPITAL_COMMUNITY): Payer: Self-pay

## 2021-03-25 DIAGNOSIS — Z79899 Other long term (current) drug therapy: Secondary | ICD-10-CM | POA: Diagnosis not present

## 2021-03-25 DIAGNOSIS — M5090 Cervical disc disorder, unspecified, unspecified cervical region: Secondary | ICD-10-CM | POA: Diagnosis not present

## 2021-03-25 DIAGNOSIS — G479 Sleep disorder, unspecified: Secondary | ICD-10-CM | POA: Diagnosis not present

## 2021-03-25 DIAGNOSIS — Z13220 Encounter for screening for lipoid disorders: Secondary | ICD-10-CM | POA: Diagnosis not present

## 2021-03-25 DIAGNOSIS — K219 Gastro-esophageal reflux disease without esophagitis: Secondary | ICD-10-CM | POA: Diagnosis not present

## 2021-03-25 DIAGNOSIS — Z8669 Personal history of other diseases of the nervous system and sense organs: Secondary | ICD-10-CM | POA: Diagnosis not present

## 2021-03-25 DIAGNOSIS — J301 Allergic rhinitis due to pollen: Secondary | ICD-10-CM | POA: Diagnosis not present

## 2021-03-25 DIAGNOSIS — Z0000 Encounter for general adult medical examination without abnormal findings: Secondary | ICD-10-CM | POA: Diagnosis not present

## 2021-03-25 DIAGNOSIS — Z13228 Encounter for screening for other metabolic disorders: Secondary | ICD-10-CM | POA: Diagnosis not present

## 2021-03-25 MED ORDER — AMLODIPINE BESYLATE 2.5 MG PO TABS
2.5000 mg | ORAL_TABLET | Freq: Every day | ORAL | 3 refills | Status: DC
  Filled 2021-03-25: qty 90, 90d supply, fill #0

## 2021-04-15 ENCOUNTER — Telehealth: Payer: Self-pay | Admitting: Neurology

## 2021-04-15 NOTE — Telephone Encounter (Signed)
Pt said she is having breakthrough headaches. She had to call out of work due to the problem. She takes aimovig which has made her blood pressure go up. And she has no rescue meds. She said she has to get something to help her.

## 2021-04-15 NOTE — Telephone Encounter (Signed)
LMOVM to call the office back.

## 2021-04-15 NOTE — Telephone Encounter (Signed)
Tried calling pt, No answer. LMOVM 

## 2021-04-15 NOTE — Telephone Encounter (Signed)
She states she called few weeks ago and lm with AN,it was sent by starla. she was told jaffe was booked and gave her a refill. She did not schedule. Said she is at work, so she might not be able to answer. She is up to taking any meds to help her.

## 2021-04-18 NOTE — Progress Notes (Signed)
NEUROLOGY FOLLOW UP OFFICE NOTE  Ashley Horton [id number]  Assessment/Plan:   Migraine without aura, without status migrainosus, not intractable Visual disturbance, may be ocular migraine however its short duration (seconds) is unusual for visual migraine aura (usually 10 to 15 minutes). 3.  Abnormal brain MRI.  Findings are nonspecific.  Reportedly progressed compared to prior MRI from 2007 (which I do not have access to for review).  Findings may be attributed to migraines.  MRI of cervical spine from 2015 showed no cord abnormalities.  Lumbar puncture reportedly normal.  At this time, she exhibits no clinical neurological symptoms other than the headache, so we will hold off on repeating a lumbar puncture.   Migraine prevention:  Due to elevated blood pressure, will discontinue Aimovig.  Will change to either Emgality or Ajovy pending her insurance preference. Migraine rescue:  Maxalt-MLT 10mg  with Zofran Limit use of pain relievers to no more than 2 days out of week to prevent risk of rebound or medication-overuse headache. Keep headache diary Follow up 6 months.   Subjective:  Ashley Horton is a 35 year old right-handed female with CKD and history of pyelonephritis with pregnancy follows up for migraines.  UPDATE: Last seen in initial consultation back in December 2021.  Started on Aimovig at that time.  No migraines (maybe a mild headache once in a while).  Insurance stopped covering it in May or June.  From May-June to August, she was off of it.  She again started waking up every morning with head pressure and nausea. Been back on for 2 months and has had 3 migraines since then, usually lasts 24 hours with ibuprofen.  She gets headaches now when she runs.  Blood pressure has been more elevated (140s/90s).    Current NSAIDS/analgesics:  ibuprofen Current triptans:  none Current ergotamine:  none Current anti-emetic:  Zofran ODT 4mg  Current muscle relaxants:  none Current  Antihypertensive medications:  none Current Antidepressant medications:  none Current Anticonvulsant medications:  none Current anti-CGRP:  Aimovig 140mg  Q28d Current Vitamins/Herbal/Supplements:  none Current Antihistamines/Decongestants:  none Other therapy:  none Hormone/birth control:  Mirena   HISTORY: She started having headaches around 2005-2006.  Headaches are moderate to severe holocephalic pressure-like and throbbing pain.  They tend to be more severe in the morning upon waking up.  There is associated nausea, photophobia and phonophobia.  They last several hours.  Around the same time, she started experiencing brief episodes of visual disturbance described as a flash of light in the right side of her visual field of both eyes that travels across the left side.  It lasts only seconds.  She also had some numbness and tingling as well as dizziness at the time.  She saw a neurologist in 2007.  MRI of brain at that time showed mild nonspecific white matter changes.  She did undergo a lumbar puncture which was reportedly negative.     .  In 2021, her headaches gradually became more frequent, to where she had a daily dull headache but had a severe headache 3 to 5 days a week.  She would wake up with a severe headache with nausea, drink a cup of coffee which would dull the headache that would last for several hours.  She would treat with ibuprofen, which she took 5 to 6 days a week.  In early October 2021, she had a new severe headache, described as an aching in the left occipital region as well as a sensation of something  in her left ear.  No neuralgia or paresthesias involving the scalp.  No rash.  No neck pain.  She is vaccinated for Covid and rapid Covid test with follow up PCR were negative.  Sed rate was 3.  In case she was having shingles, she was prescribed Valtrex and prednisone taper, which aborted the headache.Marland Kitchen  MRI of brain with and without contrast on 04/18/2020 personally reviewed  showed nonspecific cerebral white matter changes progressed since prior imaging in 2007, without abnormal enhancement and no acute intracranial abnormalities.  Small signal abnormality seen in right medullary pyramid but as it was only apparent on FLAIR, probably artifact.   She has history of neck pain with radicular pain and numbness in the arms.  MRI cervical spine 08/16/13 small disc protrusion C5-6 left C6 no cord     Past NSAIDS/analgesics:  Tramadol (ineffective, caused itching) Past abortive triptans:  sumatriptan Past abortive ergotamine:  none Past muscle relaxants:  none Past anti-emetic:  none Past antihypertensive medications:  amlodipine Past antidepressant medications:  Amitriptyline   Past anticonvulsant medications:  none Past anti-CGRP:  Nurtec (rescue - ineffective) Past vitamins/Herbal/Supplements:  none   Family history:  Paternal aunt (multiple sclerosis).  No known family history of migraine.  PAST MEDICAL HISTORY: Past Medical History:  Diagnosis Date   Anemia    1st pregnancy - Fe   Chronic kidney disease    all pregnancy dx pylonephristis - hospitalized + kidney stones   Depression    third child took zoloft for 2-3 mos   History of pyelonephritis during pregnancy    Preterm labor     MEDICATIONS: Current Outpatient Medications on File Prior to Visit  Medication Sig Dispense Refill   amLODipine (NORVASC) 2.5 MG tablet Take 1 tablet (2.5 mg total) by mouth daily. 90 tablet 3   buPROPion (WELLBUTRIN XL) 150 MG 24 hr tablet Take 1 tablet (150 mg total) by mouth daily. 30 tablet 3   Erenumab-aooe (AIMOVIG) 140 MG/ML SOAJ Inject 140 mg into the skin every 28 (twenty-eight) days. 1.12 mL 5   Erenumab-aooe 140 MG/ML SOAJ Inject 140 mg as directed every 28 (twenty-eight) days. 1 mL 5   famotidine (PEPCID) 20 MG tablet Take 1 tablet (20 mg total) by mouth 2 (two) times daily. 60 tablet 5   levonorgestrel (MIRENA, 52 MG,) 20 MCG/24HR IUD 1 each by Intrauterine  route once.     montelukast (SINGULAIR) 10 MG tablet Take 1 tablet (10 mg total) by mouth at bedtime. 30 tablet 5   ondansetron (ZOFRAN ODT) 4 MG disintegrating tablet Take 1 tablet (4 mg total) by mouth every 8 (eight) hours as needed for nausea or vomiting. 20 tablet 5   sertraline (ZOLOFT) 25 MG tablet TAKE 1 TABLET BY MOUTH ONCE DAILY. 90 tablet 1   No current facility-administered medications on file prior to visit.    ALLERGIES: Allergies  Allergen Reactions   Sulfa Antibiotics Hives    FAMILY HISTORY: Family History  Problem Relation Age of Onset   Vision loss Mother    Alcohol abuse Father    Depression Father    Drug abuse Father    COPD Father    ADD / ADHD Son    Asthma Maternal Grandmother    Heart disease Paternal Grandmother    Stroke Paternal Grandmother    Cancer Paternal Grandfather    Diabetes Paternal Grandfather    Heart disease Paternal Grandfather    Hyperlipidemia Paternal Grandfather    Hypertension Paternal Merchant navy officer  Objective:  Blood pressure (!) 143/83, pulse 84, height 5\' 7"  (1.702 m), weight 124 lb 3.2 oz (56.3 kg), SpO2 100 %. General: No acute distress.  Patient appears well-groomed.     Metta Clines, DO  CC: Gilford Rile, MD

## 2021-04-21 ENCOUNTER — Other Ambulatory Visit: Payer: Self-pay

## 2021-04-21 ENCOUNTER — Encounter: Payer: Self-pay | Admitting: Neurology

## 2021-04-21 ENCOUNTER — Ambulatory Visit (INDEPENDENT_AMBULATORY_CARE_PROVIDER_SITE_OTHER): Payer: 59 | Admitting: Neurology

## 2021-04-21 VITALS — BP 143/83 | HR 84 | Ht 67.0 in | Wt 124.2 lb

## 2021-04-21 DIAGNOSIS — G43009 Migraine without aura, not intractable, without status migrainosus: Secondary | ICD-10-CM | POA: Diagnosis not present

## 2021-04-21 MED ORDER — RIZATRIPTAN BENZOATE 10 MG PO TBDP: ORAL_TABLET | ORAL | 0 refills | Status: DC

## 2021-04-21 NOTE — Patient Instructions (Signed)
Stop Aimovig.  Ask [employer] if they would cover Emgality or Ajovy and let me know At earliest onset of migraine, take rizatriptan.  May repeat in 2 hours (maximum 2 tablets in 24 hours).  Use zofran for nausea Follow up 6 months.

## 2021-04-23 ENCOUNTER — Other Ambulatory Visit: Payer: Self-pay

## 2021-04-23 ENCOUNTER — Ambulatory Visit
Admission: EM | Admit: 2021-04-23 | Discharge: 2021-04-23 | Disposition: A | Discharge status: Home / Self Care | Payer: 59 | Attending: Emergency Medicine | Admitting: Emergency Medicine

## 2021-04-23 DIAGNOSIS — M791 Myalgia, unspecified site: Secondary | ICD-10-CM | POA: Diagnosis not present

## 2021-04-23 DIAGNOSIS — J029 Acute pharyngitis, unspecified: Secondary | ICD-10-CM

## 2021-04-23 DIAGNOSIS — J040 Acute laryngitis: Secondary | ICD-10-CM | POA: Diagnosis not present

## 2021-04-23 DIAGNOSIS — R490 Dysphonia: Secondary | ICD-10-CM | POA: Insufficient documentation

## 2021-04-23 DIAGNOSIS — J989 Respiratory disorder, unspecified: Secondary | ICD-10-CM | POA: Insufficient documentation

## 2021-04-23 LAB — POCT RAPID STREP A (OFFICE)
Rapid Strep A Screen: NEGATIVE
Rapid Strep A Screen: NEGATIVE

## 2021-04-23 MED ORDER — METHYLPREDNISOLONE 4 MG PO TBPK: ORAL_TABLET | ORAL | 0 refills | Status: DC

## 2021-04-23 NOTE — ED Provider Notes (Signed)
[facility]    CSN: [id number] Arrival date & time: 04/23/21  1507      History   Chief Complaint Chief Complaint  Patient presents with   Sore Throat    HPI Ashley Horton is a 35 y.o. female.   Pt reports having sore throat, hoarse voice and cough that started today.  Patient states she works in the [employer] at [facility] [employer] [facility].  Patient denies a history of frequent upper respiratory infections.  Patient denies a history of allergies.  Patient denies fever, aches, chills, nausea, vomiting, diarrhea, difficulty swallowing, difficulty managing secretions, difficulty maintaining airway.  Patient states she has tried some over-the-counter cold preparations with some relief.  The history is provided by the patient.   Past Medical History:  Diagnosis Date   Anemia    1st pregnancy - Fe   Chronic kidney disease    all pregnancy dx pylonephristis - hospitalized + kidney stones   Depression    third child took zoloft for 2-3 mos   History of pyelonephritis during pregnancy    Preterm labor     Patient Active Problem List   Diagnosis Date Noted   Pregnancy 02/17/2018    Past Surgical History:  Procedure Laterality Date   CESAREAN SECTION     urethro dilation     urinary reflux surgery     WISDOM TOOTH EXTRACTION     at age 50    OB History     Gravida  5   Para  4   Term  1   Preterm  1   AB  1   Living  4      SAB  1   IAB      Ectopic      Multiple  0   Live Births  4            Home Medications    Prior to Admission medications   Medication Sig Start Date End Date Taking? Authorizing Provider  methylPREDNISolone (MEDROL DOSEPAK) 4 MG TBPK tablet Take 24 mg on day 1, 20 mg on day 2, 16 mg on day 3, 12 mg on day 4, 8 mg on day 5, 4 mg on day 6. 04/23/21  Yes Lynden Oxford Scales, PA-C  Erenumab-aooe (AIMOVIG) 140 MG/ML SOAJ Inject 140 mg into the skin every 28 (twenty-eight) days. 06/17/20   Tomi Likens, Adam R, DO   Erenumab-aooe 140 MG/ML SOAJ Inject 140 mg as directed every 28 (twenty-eight) days. 06/17/20   Pieter Partridge, DO  levonorgestrel (MIRENA, 52 MG,) 20 MCG/24HR IUD 1 each by Intrauterine route once.    [provider]  ondansetron (ZOFRAN ODT) 4 MG disintegrating tablet Take 1 tablet (4 mg total) by mouth every 8 (eight) hours as needed for nausea or vomiting. 06/17/20   Tomi Likens, Adam R, DO  rizatriptan (MAXALT-MLT) 10 MG disintegrating tablet Take 1 tablet earliest onset of migraine.  May repeat in 2 hours if needed.  Maximum 2 tablets in 24 hours. 04/21/21   Pieter Partridge, DO    Family History Family History  Problem Relation Age of Onset   Vision loss Mother    Alcohol abuse Father    Depression Father    Drug abuse Father    COPD Father    ADD / ADHD Son    Asthma Maternal Grandmother    Heart disease Paternal Grandmother    Stroke Paternal Grandmother    Cancer Paternal Grandfather  Diabetes Paternal Grandfather    Heart disease Paternal Grandfather    Hyperlipidemia Paternal Grandfather    Hypertension Paternal Grandfather     Social History Social History   Tobacco Use   Smoking status: Never   Smokeless tobacco: Never  Vaping Use   Vaping Use: Never used  Substance Use Topics   Alcohol use: Not Currently    Comment: social    Drug use: Never     Allergies   Sulfa antibiotics   Review of Systems Review of Systems Pertinent findings noted in history of present illness.    Physical Exam Triage Vital Signs ED Triage Vitals  Enc Vitals Group     BP      Pulse      Resp      Temp      Temp src      SpO2      Weight      Height      Head Circumference      Peak Flow      Pain Score      Pain Loc      Pain Edu?      Excl. in [place]?    No data found.  Updated Vital Signs BP (!) 146/81 (BP Location: Right Arm)   Pulse 66   Temp 97.9 F (36.6 C) (Oral)   Resp 18   Wt 124 lb (56.2 kg)   SpO2 98%   BMI 19.42 kg/m   Visual  Acuity Right Eye Distance:   Left Eye Distance:   Bilateral Distance:    Right Eye Near:   Left Eye Near:    Bilateral Near:     Physical Exam Vitals and nursing note reviewed.  Constitutional:      Appearance: Normal appearance.  HENT:     Head: Normocephalic and atraumatic.     Comments: Dysphonia    Right Ear: Tympanic membrane, ear canal and external ear normal.     Left Ear: Tympanic membrane, ear canal and external ear normal.     Nose: Congestion and rhinorrhea present.     Right Turbinates: Not enlarged, swollen or pale.     Left Turbinates: Not enlarged, swollen or pale.     Right Sinus: No maxillary sinus tenderness or frontal sinus tenderness.     Left Sinus: No maxillary sinus tenderness or frontal sinus tenderness.     Mouth/Throat:     Mouth: Mucous membranes are moist.     Pharynx: Oropharynx is clear. Uvula midline. No pharyngeal swelling, oropharyngeal exudate, posterior oropharyngeal erythema or uvula swelling.     Tonsils: No tonsillar exudate. 0 on the right. 0 on the left.  Eyes:     Extraocular Movements: Extraocular movements intact.     Conjunctiva/sclera: Conjunctivae normal.     Pupils: Pupils are equal, round, and reactive to light.  Cardiovascular:     Rate and Rhythm: Normal rate and regular rhythm.     Pulses: Normal pulses.     Heart sounds: Normal heart sounds.  Pulmonary:     Effort: Pulmonary effort is normal.     Breath sounds: Normal breath sounds.  Musculoskeletal:     Cervical back: Normal range of motion and neck supple.  Lymphadenopathy:     Cervical: Cervical adenopathy present.     Right cervical: Posterior cervical adenopathy present.     Left cervical: Posterior cervical adenopathy present.  Neurological:     General: No focal deficit present.  Mental Status: She is alert and oriented to person, place, and time. Mental status is at baseline.  Psychiatric:        Mood and Affect: Mood normal.        Behavior: Behavior  normal.     UC Treatments / Results  Labs (all labs ordered are listed, but only abnormal results are displayed) Labs Reviewed  NOVEL CORONAVIRUS, NAA   Narrative:    Performed at:  [address] [place], [place]  [id number] Lab Director: Rush Farmer MD, Phone:  [phone]  SARS-COV-2, NAA 2 DAY TAT   Narrative:    Performed at:  [facility] [address], [place], [place]  [id number] Lab Director: Rush Farmer MD, Phone:  [phone]  CULTURE, GROUP A STREP [facility])  POCT RAPID STREP A (OFFICE)  POCT RAPID STREP A (OFFICE)    EKG   Radiology No results found.  Procedures Procedures (including critical care time)  Medications Ordered in UC Medications - No data to display  Initial Impression / Assessment and Plan / UC Course  I have reviewed the triage vital signs and the nursing notes.  Pertinent labs & imaging results that were available during my care of the patient were reviewed by me and considered in my medical decision making (see chart for details).     Laryngitis.  Physical exam findings were concerning for bacterial etiology, patient's symptoms were most likely viral.  Patient was advised to rest her voice for the next several days, I provided her with a prescription for steroids to help hasten the reduction of inflammation in her vocal cords.  Patient was given return precautions for worsening symptoms.  Patient verbalized understanding and agreement of plan as discussed.  All questions were addressed during visit.  Please see discharge instructions below for further details of plan.  Final Clinical Impressions(s) / UC Diagnoses   Final diagnoses:  Sore throat  Respiratory illness  Dysphonia  Laryngitis  Myalgia     Discharge Instructions      Thank you for visiting urgent care and thank you for your patients.  Believe that you are suffering from a viral infection is causing her to have laryngitis.  To treat  your laryngitis and hopefully get your voice back sooner, I recommend that you try a Medrol Dosepak, please take 1 row daily as prescribed until finished.  COVID test we performed today will be resulted in the next 6 to 12 hours, you will receive a phone call from our nurse to let you know the result.  I have provided you a note to return back to work on Friday.     ED Prescriptions     Medication Sig Dispense Auth. Provider   methylPREDNISolone (MEDROL DOSEPAK) 4 MG TBPK tablet Take 24 mg on day 1, 20 mg on day 2, 16 mg on day 3, 12 mg on day 4, 8 mg on day 5, 4 mg on day 6. 21 tablet Lynden Oxford Scales, PA-C      PDMP not reviewed this encounter.   Lynden Oxford Scales, [place] 04/25/21 [phone]

## 2021-04-23 NOTE — Discharge Instructions (Signed)
Thank you for visiting urgent care and thank you for your patients.  Believe that you are suffering from a viral infection is causing her to have laryngitis.  To treat your laryngitis and hopefully get your voice back sooner, I recommend that you try a Medrol Dosepak, please take 1 row daily as prescribed until finished.  COVID test we performed today will be resulted in the next 6 to 12 hours, you will receive a phone call from our nurse to let you know the result.  I have provided you a note to return back to work on Friday.

## 2021-04-23 NOTE — ED Triage Notes (Signed)
Pt reports having sore throat and cough that started today.

## 2021-04-24 LAB — SARS-COV-2, NAA 2 DAY TAT

## 2021-04-24 LAB — NOVEL CORONAVIRUS, NAA: SARS-CoV-2, NAA: NOT DETECTED

## 2021-04-27 LAB — CULTURE, GROUP A STREP (THRC)

## 2021-05-13 ENCOUNTER — Other Ambulatory Visit: Payer: Self-pay | Admitting: Neurology

## 2021-05-13 MED ORDER — EMGALITY 120 MG/ML ~~LOC~~ SOAJ: 240.0000 mg | Freq: Once | SUBCUTANEOUS | 0 refills | Status: AC

## 2021-05-13 MED ORDER — EMGALITY 120 MG/ML ~~LOC~~ SOAJ
120.0000 mg | SUBCUTANEOUS | 5 refills | Status: DC
  Filled 2021-05-26 – 2021-06-18 (×3): qty 1, 28d supply, fill #0

## 2021-05-15 ENCOUNTER — Telehealth: Payer: Self-pay

## 2021-05-15 NOTE — Telephone Encounter (Signed)
New message  Your information has been sent to [place].  [place] Me[facility] Key: [facility] - [place] Case ID: [id number] - Rx #: [id number] Need help? Call us at [phone] Status Sent to [employer] Drug Emgality 120MG /ML auto-injectors (migraine) Form MedImpact ePA Form 2017 NCPDP Original Claim Info 75

## 2021-05-19 NOTE — Telephone Encounter (Signed)
F/u   [place] Me[employer] Key: [facility] - [place] Case ID: [id number] - Rx #: [id number] Need help? Call us at [phone] Outcome Approvedon November 10 The request has been approved. The authorization is effective for a maximum of 1 fills from 05/15/2021 to 06/13/2021, as long as the member is enrolled in their current health plan. The request was approved as submitted. This request is approved for pens per 30 days.An additional [place] [phone]) has been entered for Emgality 120mg /mL with a quantity limit of 1 pen per 30 days effective 06/07/2021 through 11/04/2021. A written notification letter will follow with additional details. Drug Emgality 120MG /ML auto-injectors (migraine) Form MedImpact ePA Form 2017 NCPDP Original Claim Info 75

## 2021-05-20 ENCOUNTER — Other Ambulatory Visit: Payer: Self-pay | Admitting: Neurology

## 2021-05-26 ENCOUNTER — Other Ambulatory Visit (HOSPITAL_COMMUNITY): Payer: Self-pay

## 2021-05-26 MED ORDER — GALCANEZUMAB-GNLM 120 MG/ML ~~LOC~~ SOSY
240.0000 mg | PREFILLED_SYRINGE | SUBCUTANEOUS | 1 refills | Status: DC
  Filled 2021-05-26: qty 240, 1d supply, fill #0
  Filled 2021-05-26 (×2): qty 240, 28d supply, fill #0
  Filled 2021-06-05: qty 2, 30d supply, fill #0

## 2021-05-26 MED ORDER — EMGALITY 120 MG/ML ~~LOC~~ SOSY
240.0000 mL | PREFILLED_SYRINGE | SUBCUTANEOUS | 0 refills | Status: DC
  Filled 2021-05-26: qty 240, 28d supply, fill #0

## 2021-05-27 ENCOUNTER — Telehealth: Payer: Self-pay

## 2021-05-27 NOTE — Telephone Encounter (Signed)
New message   Your information has been sent to [place].  Ashley Horton ([name]) Rx #: [id number] Emgality 120MG /ML syringes (migraine)   Form MedImpact ePA Form 2017 NCPDP Created 1 day ago Sent to Plan 2 minutes ago Plan Response 2 minutes ago Submit Clinical Questions less than a minute ago Determination Wait for Determination Please wait for MedImpact 2017 to return a determination.

## 2021-06-02 NOTE — Telephone Encounter (Signed)
F/u   This request has been approved.  Please note any additional information provided by MedImpact at the bottom of your screen.  Ashley Horton ([name]) Rx #: [id number] Emgality 120MG /ML syringes (migraine)   Form MedImpact ePA Form 2017 NCPDP Created 7 days ago Sent to Plan 6 days ago Plan Response 6 days ago Submit Clinical Questions 6 days ago Determination Favorable 6 days ago Message from Plan The request has been approved. The authorization is effective for a maximum of 12 fills from 05/27/2021 to 05/26/2022, as long as the member is enrolled in their current health plan. The request was approved with a quantity restriction. This has been approved for a quantity limit of 1 with a day supply limit of 30. A written notification letter will follow with additional details.

## 2021-06-05 ENCOUNTER — Other Ambulatory Visit (HOSPITAL_COMMUNITY): Payer: Self-pay

## 2021-06-15 ENCOUNTER — Telehealth: Payer: 59 | Admitting: Nurse Practitioner

## 2021-06-15 DIAGNOSIS — J029 Acute pharyngitis, unspecified: Secondary | ICD-10-CM

## 2021-06-15 MED ORDER — PENICILLIN V POTASSIUM 500 MG PO TABS: 500.0000 mg | ORAL_TABLET | Freq: Two times a day (BID) | ORAL | 0 refills | Status: AC

## 2021-06-15 NOTE — Progress Notes (Signed)
E-Visit for Sore Throat - Strep Symptoms  We are sorry that you are not feeling well.  Here is how we plan to help!  Based on what you have shared with me it is likely that you have strep pharyngitis.  Strep pharyngitis is inflammation and infection in the back of the throat.  This is an infection cause by bacteria and is treated with antibiotics.  I have prescribed Penicillin V 500 mg twice a day for 10 days. For throat pain, we recommend over the counter oral pain relief medications such as acetaminophen or aspirin, or anti-inflammatory medications such as ibuprofen or naproxen sodium. Topical treatments such as oral throat lozenges or sprays may be used as needed. Strep infections are not as easily transmitted as other respiratory infections, however we still recommend that you avoid close contact with loved ones, especially the very young and elderly.  Remember to wash your hands thoroughly throughout the day as this is the number one way to prevent the spread of infection and wipe down door knobs and counters with disinfectant.   Home Care: Only take medications as instructed by your medical team. Complete the entire course of an antibiotic. Do not take these medications with alcohol. A steam or ultrasonic humidifier can help congestion.  You can place a towel over your head and breathe in the steam from hot water coming from a faucet. Avoid close contacts especially the very young and the elderly. Cover your mouth when you cough or sneeze. Always remember to wash your hands.  Get Help Right Away If: You develop worsening fever or sinus pain. You develop a severe head ache or visual changes. Your symptoms persist after you have completed your treatment plan.  Make sure you Understand these instructions. Will watch your condition. Will get help right away if you are not doing well or get worse.   Thank you for choosing an e-visit.  Your e-visit answers were reviewed by a board  certified advanced clinical practitioner to complete your personal care plan. Depending upon the condition, your plan could have included both over the counter or prescription medications.  Please review your pharmacy choice. Make sure the pharmacy is open so you can pick up prescription now. If there is a problem, you may contact your provider through [employer] and have the prescription routed to another pharmacy.  Your safety is important to [place]. If you have drug allergies check your prescription carefully.   For the next 24 hours you can use MyChart to ask questions about today's visit, request a non-urgent call back, or ask for a work or school excuse. You will get an email in the next two days asking about your experience. I hope that your e-visit has been valuable and will speed your recovery.   I have spent at least 5 minutes reviewing and documenting in the patient's chart.

## 2021-06-18 ENCOUNTER — Other Ambulatory Visit (HOSPITAL_COMMUNITY): Payer: Self-pay

## 2021-07-08 NOTE — Progress Notes (Signed)
FMLA ready to be faxed. Copy to scanned

## 2021-07-09 ENCOUNTER — Telehealth: Payer: Self-pay | Admitting: Neurology

## 2021-07-09 DIAGNOSIS — Z0279 Encounter for issue of other medical certificate: Secondary | ICD-10-CM

## 2021-07-09 NOTE — Telephone Encounter (Signed)
Patient called about her FMLA forms that Dr. Tomi Likens will be completing for the first time.  She previously had them completed by her PCP before getting a neurologist.  She said the verbiage used after a lot of back and forth with HR ended up being as follows, "Intermittent leave for migraines up to two occurences a month, each occurrence up to two days as needed."  FYI only.  Forms placed in Dr. Georgie Chard inbox.

## 2021-07-10 NOTE — Telephone Encounter (Signed)
LMOVM for pt, FMLA forms filled out.

## 2021-07-21 NOTE — Telephone Encounter (Signed)
FMLA forms faxed off.

## 2021-08-07 ENCOUNTER — Other Ambulatory Visit (HOSPITAL_COMMUNITY): Payer: Self-pay

## 2021-08-07 MED ORDER — LOSARTAN POTASSIUM 25 MG PO TABS
25.0000 mg | ORAL_TABLET | Freq: Every day | ORAL | 3 refills | Status: DC
  Filled 2021-08-07: qty 90, 90d supply, fill #0

## 2021-10-05 ENCOUNTER — Telehealth: Payer: 59 | Admitting: Family

## 2021-10-05 DIAGNOSIS — L709 Acne, unspecified: Secondary | ICD-10-CM

## 2021-10-05 MED ORDER — CLINDAMYCIN PHOS-BENZOYL PEROX 1-5 % EX GEL: Freq: Two times a day (BID) | CUTANEOUS | 0 refills | Status: DC

## 2021-10-05 MED ORDER — SPIRONOLACTONE 25 MG PO TABS: 25.0000 mg | ORAL_TABLET | Freq: Every day | ORAL | 1 refills | Status: DC

## 2021-10-05 NOTE — Progress Notes (Signed)
We are sorry that you are experiencing this issue.  Here is how we plan to help! ? ?Based on what you shared with me it looks like you have uncomplicated acne.  Acne is a disorder of the hair follicles and oil glands (sebaceous glands). The sebaceous glands secrete oils to keep the skin moist.  When the glands get clogged, it can lead to pimples or cysts.  These cysts may become infected and leave scars. Acne is very common and normally occurs at puberty.  Acne is also inherited. ? ?Your personal care plan consists of the following recommendations: ? ?I recommend that you use a daily cleanser ? ?You might try 2% topical salicylic acid pads or wipes.  Use the pads to daily cleanse your skin. ? ?I have prescribed a topical gel with an antibiotic: ? ?Benzoyl peroxide-erythromycin gel.  This gel should be applied to the affected areas twice a day. Be sure to read the package insert for potential side effects. ? ?I have also prescribed one of the following additional therapies: ? ?Spironolactone 25 mg daily. You need to follow up with your PCP if you wish to continue this medication.  ? ?If excessive dryness or peeling occurs, reduce dose frequency or concentration of the topical scrubs.  If excessive stinging or burning occurs, remove the topical gel with mild soap and water and resume at a lower dose the next day.  Remember oral antibiotics and topical acne treatments may increase your sensitivity to the sun! ? ?HOME CARE: ?Do not squeeze pimples because that can often lead to infections, worse acne, and scars. ?Use a moisturizer that contains retinoid or fruit acids that may inhibit the development of new acne lesions. ?Although there is not a clear link that foods can cause acne, doctors do believe that too many sweets predispose you to skin problems. ? ?GET HELP RIGHT AWAY IF: ?If your acne gets worse or is not better within 10 days. ?If you become depressed. ?If you become pregnant, discontinue medications and call  your OB/GYN. ? ?MAKE SURE YOU: ?Understand these instructions. ?Will watch your condition. ?Will get help right away if you are not doing well or get worse. ? ?Thank you for choosing an e-visit. ? ?Your e-visit answers were reviewed by a board certified advanced clinical practitioner to complete your personal care plan. Depending upon the condition, your plan could have included both over the counter or prescription medications. ? ?Please review your pharmacy choice. Make sure the pharmacy is open so you can pick up prescription now. If there is a problem, you may contact your provider through [employer] and have the prescription routed to another pharmacy.  Your safety is important to [place]. If you have drug allergies check your prescription carefully.  ? ?For the next 24 hours you can use MyChart to ask questions about today's visit, request a non-urgent call back, or ask for a work or school excuse. ?You will get an email in the next two days asking about your experience. I hope that your e-visit has been valuable and will speed your recovery. ? ?Approximately 5 minutes was spent documenting and reviewing patient's chart.  ? ? ?

## 2021-10-27 ENCOUNTER — Other Ambulatory Visit: Payer: Self-pay | Admitting: Family

## 2021-10-27 DIAGNOSIS — L709 Acne, unspecified: Secondary | ICD-10-CM

## 2021-11-03 NOTE — Progress Notes (Deleted)
NEUROLOGY FOLLOW UP OFFICE NOTE  Ashley Horton [id number]  Assessment/Plan:   Migraine without aura, without status migrainosus, not intractable Visual disturbance, may be ocular migraine however its short duration (seconds) is unusual for visual migraine aura (usually 10 to 15 minutes). 3.  Abnormal brain MRI.  Findings are nonspecific.  Reportedly progressed compared to prior MRI from 2007 (which I do not have access to for review).  Findings may be attributed to migraines.  MRI of cervical spine from 2015 showed no cord abnormalities.  Lumbar puncture reportedly normal.  At this time, she exhibits no clinical neurological symptoms other than the headache, so we will hold off on repeating a lumbar puncture.    Migraine prevention:  Due to elevated blood pressure, will discontinue Aimovig.  Will change to either Emgality or Ajovy pending her insurance preference. Migraine rescue:  Maxalt-MLT '10mg'$  with Zofran Limit use of pain relievers to no more than 2 days out of week to prevent risk of rebound or medication-overuse headache. Keep headache diary Follow up 6 months.     Subjective:  Ashley Horton is a 36 year old right-handed female with CKD and history of pyelonephritis with pregnancy follows up for migraines.   UPDATE: Stopped Aimovig due to elevated blood pressure.  Started [employer] in November.   Intensity:  *** Duration:  *** Frequency:  ***   Current NSAIDS/analgesics:  ibuprofen Current triptans:  Maxalt '10mg'$  Current ergotamine:  none Current anti-emetic:  Zofran ODT '4mg'$  Current muscle relaxants:  none Current Antihypertensive medications:  none Current Antidepressant medications:  none Current Anticonvulsant medications:  none Current anti-CGRP:  Emgality Current Vitamins/Herbal/Supplements:  none Current Antihistamines/Decongestants:  none Other therapy:  none Hormone/birth control:  Mirena   HISTORY: She started having headaches around 2005-2006.  Headaches  are moderate to severe holocephalic pressure-like and throbbing pain.  They tend to be more severe in the morning upon waking up.  There is associated nausea, photophobia and phonophobia.  They last several hours.  Around the same time, she started experiencing brief episodes of visual disturbance described as a flash of light in the right side of her visual field of both eyes that travels across the left side.  It lasts only seconds.  She also had some numbness and tingling as well as dizziness at the time.  She saw a neurologist in 2007.  MRI of brain at that time showed mild nonspecific white matter changes.  She did undergo a lumbar puncture which was reportedly negative.     .  In 2021, her headaches gradually became more frequent, to where she had a daily dull headache but had a severe headache 3 to 5 days a week.  She would wake up with a severe headache with nausea, drink a cup of coffee which would dull the headache that would last for several hours.  She would treat with ibuprofen, which she took 5 to 6 days a week.  In early October 2021, she had a new severe headache, described as an aching in the left occipital region as well as a sensation of something in her left ear.  No neuralgia or paresthesias involving the scalp.  No rash.  No neck pain.  She is vaccinated for Covid and rapid Covid test with follow up PCR were negative.  Sed rate was 3.  In case she was having shingles, she was prescribed Valtrex and prednisone taper, which aborted the headache.Marland Kitchen  MRI of brain with and without contrast on 04/18/2020 personally  reviewed showed nonspecific cerebral white matter changes progressed since prior imaging in 2007, without abnormal enhancement and no acute intracranial abnormalities.  Small signal abnormality seen in right medullary pyramid but as it was only apparent on FLAIR, probably artifact.   She has history of neck pain with radicular pain and numbness in the arms.  MRI cervical spine 08/16/13  small disc protrusion C5-6 left C6 no cord     Past NSAIDS/analgesics:  Tramadol (ineffective, caused itching) Past abortive triptans:  sumatriptan Past abortive ergotamine:  none Past muscle relaxants:  none Past anti-emetic:  none Past antihypertensive medications:  amlodipine Past antidepressant medications:  Amitriptyline   Past anticonvulsant medications:  none Past anti-CGRP:  Aimovig '140mg'$  (hypertension), Nurtec (rescue - ineffective) Past vitamins/Herbal/Supplements:  none   Family history:  Paternal aunt (multiple sclerosis).  No known family history of migraine.  PAST MEDICAL HISTORY: Past Medical History:  Diagnosis Date   Anemia    1st pregnancy - Fe   Chronic kidney disease    all pregnancy dx pylonephristis - hospitalized + kidney stones   Depression    third child took zoloft for 2-3 mos   History of pyelonephritis during pregnancy    Preterm labor     MEDICATIONS: Current Outpatient Medications on File Prior to Visit  Medication Sig Dispense Refill   clindamycin-benzoyl peroxide (BENZACLIN) gel Apply topically 2 (two) times daily. 25 g 0   Erenumab-aooe (AIMOVIG) 140 MG/ML SOAJ Inject 140 mg into the skin every 28 (twenty-eight) days. 1.12 mL 5   Erenumab-aooe 140 MG/ML SOAJ Inject 140 mg as directed every 28 (twenty-eight) days. 1 mL 5   Galcanezumab-gnlm (EMGALITY) 120 MG/ML SOAJ Inject 120 mg into the skin every 28 (twenty-eight) days. 1 mL 5   Galcanezumab-gnlm 120 MG/ML SOSY Inject 240 mg into the skin for 1 dose (loading dose). 2 mL 1   levonorgestrel (MIRENA, 52 MG,) 20 MCG/24HR IUD 1 each by Intrauterine route once.     losartan (COZAAR) 25 MG tablet Take 1 tablet (25 mg total) by mouth daily. 90 tablet 3   methylPREDNISolone (MEDROL DOSEPAK) 4 MG TBPK tablet Take 24 mg on day 1, 20 mg on day 2, 16 mg on day 3, 12 mg on day 4, 8 mg on day 5, 4 mg on day 6. 21 tablet 0   ondansetron (ZOFRAN ODT) 4 MG disintegrating tablet Take 1 tablet (4 mg total) by  mouth every 8 (eight) hours as needed for nausea or vomiting. 20 tablet 5   rizatriptan (MAXALT-MLT) 10 MG disintegrating tablet TAKE 1 TABLET EARLIEST ONSET OF MIGRAINE. MAY REPEAT IN 2 HOURS IF NEEDED MAX 2 TAB IN 24 HOURS. 10 tablet 0   spironolactone (ALDACTONE) 25 MG tablet Take 1 tablet (25 mg total) by mouth daily. 30 tablet 1   No current facility-administered medications on file prior to visit.    ALLERGIES: Allergies  Allergen Reactions   Sulfa Antibiotics Hives    FAMILY HISTORY: Family History  Problem Relation Age of Onset   Vision loss Mother    Alcohol abuse Father    Depression Father    Drug abuse Father    COPD Father    ADD / ADHD Son    Asthma Maternal Grandmother    Heart disease Paternal Grandmother    Stroke Paternal Grandmother    Cancer Paternal Grandfather    Diabetes Paternal Grandfather    Heart disease Paternal Grandfather    Hyperlipidemia Paternal Grandfather    Hypertension Paternal  Grandfather       Objective:  *** General: No acute distress.  Patient appears ***-groomed.   Head:  Normocephalic/atraumatic Eyes:  Fundi examined but not visualized Neck: supple, no paraspinal tenderness, full range of motion Heart:  Regular rate and rhythm Lungs:  Clear to auscultation bilaterally Back: No paraspinal tenderness Neurological Exam: alert and oriented to person, place, and time.  Speech fluent and not dysarthric, language intact.  CN II-XII intact. Bulk and tone normal, muscle strength 5/5 throughout.  Sensation to light touch intact.  Deep tendon reflexes 2+ throughout, toes downgoing.  Finger to nose testing intact.  Gait normal, Romberg negative.   Metta Clines, DO  CC: ***

## 2021-11-04 ENCOUNTER — Ambulatory Visit: Payer: 59 | Admitting: Neurology

## 2021-11-04 ENCOUNTER — Encounter: Payer: Self-pay | Admitting: Neurology

## 2021-11-04 DIAGNOSIS — Z029 Encounter for administrative examinations, unspecified: Secondary | ICD-10-CM

## 2022-01-15 ENCOUNTER — Other Ambulatory Visit (HOSPITAL_COMMUNITY): Payer: Self-pay

## 2022-01-15 MED ORDER — METHYLPREDNISOLONE 4 MG PO TBPK
ORAL_TABLET | ORAL | 6 refills | Status: DC
  Filled 2022-01-15: qty 21, 6d supply, fill #0

## 2022-04-01 DIAGNOSIS — Z111 Encounter for screening for respiratory tuberculosis: Secondary | ICD-10-CM | POA: Diagnosis not present

## 2022-04-03 DIAGNOSIS — M5010 Cervical disc disorder with radiculopathy, unspecified cervical region: Secondary | ICD-10-CM | POA: Diagnosis not present

## 2022-04-03 DIAGNOSIS — Z1329 Encounter for screening for other suspected endocrine disorder: Secondary | ICD-10-CM | POA: Diagnosis not present

## 2022-04-03 DIAGNOSIS — J301 Allergic rhinitis due to pollen: Secondary | ICD-10-CM | POA: Diagnosis not present

## 2022-04-03 DIAGNOSIS — M5412 Radiculopathy, cervical region: Secondary | ICD-10-CM | POA: Diagnosis not present

## 2022-04-03 DIAGNOSIS — Z8669 Personal history of other diseases of the nervous system and sense organs: Secondary | ICD-10-CM | POA: Diagnosis not present

## 2022-04-03 DIAGNOSIS — Z131 Encounter for screening for diabetes mellitus: Secondary | ICD-10-CM | POA: Diagnosis not present

## 2022-04-03 DIAGNOSIS — Z0000 Encounter for general adult medical examination without abnormal findings: Secondary | ICD-10-CM | POA: Diagnosis not present

## 2022-04-03 DIAGNOSIS — Z13220 Encounter for screening for lipoid disorders: Secondary | ICD-10-CM | POA: Diagnosis not present

## 2022-04-03 DIAGNOSIS — R5383 Other fatigue: Secondary | ICD-10-CM | POA: Diagnosis not present

## 2022-04-03 DIAGNOSIS — K219 Gastro-esophageal reflux disease without esophagitis: Secondary | ICD-10-CM | POA: Diagnosis not present

## 2022-04-03 DIAGNOSIS — Z130 Encounter for screening for diseases of the blood and blood-forming organs and certain disorders involving the immune mechanism: Secondary | ICD-10-CM | POA: Diagnosis not present

## 2022-04-03 DIAGNOSIS — Z13228 Encounter for screening for other metabolic disorders: Secondary | ICD-10-CM | POA: Diagnosis not present

## 2022-04-03 DIAGNOSIS — M5090 Cervical disc disorder, unspecified, unspecified cervical region: Secondary | ICD-10-CM | POA: Diagnosis not present

## 2022-06-24 ENCOUNTER — Telehealth: Payer: 59 | Admitting: Physician Assistant

## 2022-06-24 DIAGNOSIS — M549 Dorsalgia, unspecified: Secondary | ICD-10-CM

## 2022-06-24 MED ORDER — METHYLPREDNISOLONE 4 MG PO TBPK: ORAL_TABLET | ORAL | 0 refills | Status: DC

## 2022-06-24 MED ORDER — CYCLOBENZAPRINE HCL 10 MG PO TABS: 10.0000 mg | ORAL_TABLET | Freq: Three times a day (TID) | ORAL | 0 refills | Status: DC | PRN

## 2022-06-24 NOTE — Progress Notes (Signed)

## 2022-06-24 NOTE — Progress Notes (Signed)
I have spent 5 minutes in review of e-visit questionnaire, review and updating patient chart, medical decision making and response to patient.   Trentyn Boisclair Cody Kimbely Whiteaker, PA-C    

## 2022-07-08 ENCOUNTER — Telehealth: Payer: Self-pay | Admitting: Physician Assistant

## 2022-07-08 DIAGNOSIS — B9689 Other specified bacterial agents as the cause of diseases classified elsewhere: Secondary | ICD-10-CM

## 2022-07-08 DIAGNOSIS — J019 Acute sinusitis, unspecified: Secondary | ICD-10-CM

## 2022-07-08 MED ORDER — AMOXICILLIN-POT CLAVULANATE 875-125 MG PO TABS: 1.0000 | ORAL_TABLET | Freq: Two times a day (BID) | ORAL | 0 refills | Status: DC

## 2022-07-08 NOTE — Progress Notes (Signed)

## 2022-12-01 ENCOUNTER — Ambulatory Visit
Admission: RE | Admit: 2022-12-01 | Discharge: 2022-12-01 | Disposition: A | Discharge status: Home / Self Care | Payer: 59 | Source: Ambulatory Visit | Attending: Family Medicine | Admitting: Family Medicine

## 2022-12-01 ENCOUNTER — Other Ambulatory Visit: Payer: Self-pay | Admitting: Family Medicine

## 2022-12-01 DIAGNOSIS — R1032 Left lower quadrant pain: Secondary | ICD-10-CM

## 2022-12-01 MED ORDER — IOPAMIDOL (ISOVUE-300) INJECTION 61%
100.0000 mL | Freq: Once | INTRAVENOUS | Status: AC | PRN
  Administered 2022-12-01: 100 mL via INTRAVENOUS

## 2023-03-01 ENCOUNTER — Encounter: Payer: Self-pay | Admitting: Nurse Practitioner

## 2023-03-01 ENCOUNTER — Other Ambulatory Visit: Payer: Commercial Managed Care - PPO

## 2023-03-01 ENCOUNTER — Ambulatory Visit (INDEPENDENT_AMBULATORY_CARE_PROVIDER_SITE_OTHER): Payer: Commercial Managed Care - PPO | Admitting: Nurse Practitioner

## 2023-03-01 DIAGNOSIS — R1030 Lower abdominal pain, unspecified: Secondary | ICD-10-CM

## 2023-03-01 DIAGNOSIS — R194 Change in bowel habit: Secondary | ICD-10-CM

## 2023-03-01 MED ORDER — HYOSCYAMINE SULFATE 0.125 MG PO TABS: 0.1250 mg | ORAL_TABLET | Freq: Two times a day (BID) | ORAL | 0 refills | Status: DC | PRN

## 2023-03-01 NOTE — Patient Instructions (Addendum)
Please increase your benefiber to twice daily.   We have scheduled you a follow up with [name] on 05/14/2023 at 2:00pm. - Give [place] a call if symptoms worsen with abd pain.   We have sent the following medications to your pharmacy for you to pick up at your convenience: Levsin    Your provider has requested that you go to the basement level for lab work before leaving today. Press "B" on the elevator. The lab is located at the first door on the left as you exit the elevator.  _______________________________________________________  If your blood pressure at your visit was 140/90 or greater, please contact your primary care physician to follow up on this.  _______________________________________________________  If you are age 20 or older, your body mass index should be between 23-30. Your Body mass index is 19.73 kg/m. If this is out of the aforementioned range listed, please consider follow up with your Primary Care Provider.  If you are age 29 or younger, your body mass index should be between 19-25. Your Body mass index is 19.73 kg/m. If this is out of the aformentioned range listed, please consider follow up with your Primary Care Provider.   ________________________________________________________  The [facility] providers would like to encourage you to use [facility] to communicate with providers for non-urgent requests or questions.  Due to long hold times on the telephone, sending your provider a message by [facility] may be a faster and more efficient way to get a response.  Please allow 48 business hours for a response.  Please remember that this is for non-urgent requests.  _______________________________________________________ It was a pleasure to see you today!  Thank you for trusting me with your gastrointestinal care!

## 2023-03-01 NOTE — Progress Notes (Signed)
Primary GI: new - Ashley Bologna, MD  ASSESSMENT & PLAN   37 y.o. yo female with a past medical history consisting of, but not necessarily limited to kidney stones, bilateral ureteral reimplantation age 73, mild diverticulosis  Recent development of episodic, severe generalized lower abdominal pain without any aggravating or alleviating factors. Now resolved.  Symptoms lasted a couple of months during which time she experienced about 5 episodes.  No recurrent abdominal pain in a month. Etiology unclear. Unrevealing CT scan in late May. -Though the pain is resolved patient wanted to come for this appointment in case the pain recurred -Trial of Levsin 0.125 mg BID prn abdominal pain, #30  Bowel changes with alternating constipation and diarrhea for the last several months.  Etiology unclear. Resolving.  Stools are now formed after she started daily Benefiber but not yet back to her baseline frequency of 3 bowel movements a week.  -TSH normal in Sept 2023.  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  Referred by her PCP for abdominal pain and bowel changes.    Several months ago Ashley Horton began having bowel changes with alternating constipation and diarrhea.  Then, in late May she began having episodes of severe generalized lower abdominal pain.  The abdominal pain was not related to anything in particular.  Eating did not affect the pain.  Pain was not positional/related to movement.  The pain was "eased" but not alleviated with bowel movements.  The pain was not associated with any dysuria or vaginal symptoms though she does note that it is time for her IUD to be removed. She has not had an episode of abdominal pain for a month but wanted  to come in for this appointment in case the pain recurred at some point.   Her bowel habits have improved but are not yet back to baseline.  Stools are no longer loose but she is only having a bowel movement about twice a week compared to her baseline of about 3 bowel movements a week.  She has not had any blood in her stool.  There is no known family history of colon cancer.  No family history of inflammatory bowel disease.    She has chronic joint pain involving her knees,  hips and elbows.  She has been experiencing random rashes on her torso since the end of May  Shortly after pain started she was seen at [facility] 12/01/2022.  CBC was normal. CMP normal except for T bili of 1.8 ( chronically elevated).  CT scan was unrevealing  CT AP with contrast 12/01/22 IMPRESSION: 1. Heterogeneous enhancement of the left-greater-than-right kidneys with associated areas of focal cortical thinning, likely due to chronic scarring. Recommend correlation with urinalysis to exclude infection. 2. Punctate bilateral nonobstructing renal stones  Labs      Latest Ref Rng & Units 05/16/2019   10:44 AM 11/11/2018   12:34 PM 02/18/2018    6:09 AM  CBC  WBC 3.4 - 10.8 x10E3/uL 4.9  6.2  9.7   Hemoglobin 11.1 - 15.9 g/dL 86.5  78.4  9.7   Hematocrit 34.0 - 46.6 % 42.2  44.6  28.2   Platelets 150 - 400 K/uL  201  146  No results found for: "LIPASE"    Latest Ref Rng & Units 05/16/2019   10:44 AM 11/11/2018   12:34 PM  CMP  Glucose 65 - 99 mg/dL 94  161   BUN 6 - 20 mg/dL 12  12   Creatinine 0.96 - 1.00 mg/dL 0.45  4.09   Sodium 811 - 144 mmol/L 140  140   Potassium 3.5 - 5.2 mmol/L 4.0  3.9   Chloride 96 - 106 mmol/L 101  105   CO2 20 - 29 mmol/L 26  21   Calcium 8.7 - 10.2 mg/dL 9.5  9.4   Total Protein 6.0 - 8.5 g/dL 7.1    Total Bilirubin 0.0 - 1.2 mg/dL 1.3    Alkaline Phos 39 - 117 IU/L 61    AST 0 - 40 IU/L 17    ALT 0 - 32 IU/L 10       CT ABDOMEN PELVIS W CONTRAST CLINICAL DATA:   Left lower quadrant abdominal pain  EXAM: CT ABDOMEN AND PELVIS WITH CONTRAST  TECHNIQUE: Multidetector CT imaging of the abdomen and pelvis was performed using the standard protocol following bolus administration of intravenous contrast.  RADIATION DOSE REDUCTION: This exam was performed according to the departmental dose-optimization program which includes automated exposure control, adjustment of the mA and/or kV according to patient size and/or use of iterative reconstruction technique.  CONTRAST:  ISOVUE-300 IOPAMIDOL (ISOVUE-300) INJECTION 61%  COMPARISON:  CT abdomen and pelvis dated Nov 28 2004  FINDINGS: Lower chest: No acute abnormality.  Hepatobiliary: No focal liver abnormality is seen. No gallstones, gallbladder wall thickening, or biliary dilatation.  Pancreas: Unremarkable. No pancreatic ductal dilatation or surrounding inflammatory changes.  Spleen: Normal in size without focal abnormality.  Adrenals/Urinary Tract: Bilateral adrenal glands are unremarkable. Heterogeneous enhancement of the left-greater-than-right kidneys with associated areas of focal cortical thinning with associated areas of focal cortical thinning, likely due to chronic scarring. No hydronephrosis. Punctate bilateral nonobstructing renal stones. Bladder is unremarkable.  Stomach/Bowel: Stomach is within normal limits. Appendix appears normal. Mild diverticulosis. No evidence of bowel wall thickening, distention, or inflammatory changes.  Vascular/Lymphatic: No significant vascular findings are present. No enlarged abdominal or pelvic lymph nodes.  Reproductive: IUD in place.  Bilateral adnexa are unremarkable.  Other: No abdominal wall hernia or abnormality. No abdominopelvic ascites.  Musculoskeletal: No acute or significant osseous findings.  IMPRESSION: 1. Heterogeneous enhancement of the left-greater-than-right kidneys with associated areas of focal cortical thinning,  likely due to chronic scarring. Recommend correlation with urinalysis to exclude infection. 2. Punctate bilateral nonobstructing renal stones.  Electronically Signed   By: Allegra Lai M.D.   On: 12/02/2022 10:52    Past Medical History:  Diagnosis Date   Anemia    1st pregnancy - Fe   Chronic kidney disease    all pregnancy dx pylonephristis - hospitalized + kidney stones   Depression    third child took zoloft for 2-3 mos   History of pyelonephritis during pregnancy    Preterm labor    Past Surgical History:  Procedure Laterality Date   CESAREAN SECTION     urethro dilation     urinary reflux surgery     WISDOM TOOTH EXTRACTION     at age 41   Family History  Problem Relation Age of Onset   Vision loss Mother    Alcohol abuse Father    Depression Father    Drug abuse Father    COPD Father    ADD /  ADHD Son    Asthma Maternal Grandmother    Heart disease Paternal Grandmother    Stroke Paternal Grandmother    Cancer Paternal Grandfather    Diabetes Paternal Grandfather    Heart disease Paternal Grandfather    Hyperlipidemia Paternal Grandfather    Hypertension Paternal Grandfather    Social History   Tobacco Use   Smoking status: Never   Smokeless tobacco: Never  Vaping Use   Vaping status: Never Used  Substance Use Topics   Alcohol use: Not Currently    Comment: social    Drug use: Never   Current Outpatient Medications  Medication Sig Dispense Refill   levonorgestrel (MIRENA, 52 MG,) 20 MCG/24HR IUD 1 each by Intrauterine route once.     amoxicillin-clavulanate (AUGMENTIN) 875-125 MG tablet Take 1 tablet by mouth 2 (two) times daily. 14 tablet 0   clindamycin-benzoyl peroxide (BENZACLIN) gel Apply topically 2 (two) times daily. 25 g 0   cyclobenzaprine (FLEXERIL) 10 MG tablet Take 1 tablet (10 mg total) by mouth 3 (three) times daily as needed for muscle spasms. (Patient not taking: Reported on 03/01/2023) 30 tablet 0   Erenumab-aooe (AIMOVIG) 140  MG/ML SOAJ Inject 140 mg into the skin every 28 (twenty-eight) days. 1.12 mL 5   Erenumab-aooe 140 MG/ML SOAJ Inject 140 mg as directed every 28 (twenty-eight) days. 1 mL 5   Galcanezumab-gnlm (EMGALITY) 120 MG/ML SOAJ Inject 120 mg into the skin every 28 (twenty-eight) days. 1 mL 5   Galcanezumab-gnlm 120 MG/ML SOSY Inject 240 mg into the skin for 1 dose (loading dose). 2 mL 1   losartan (COZAAR) 25 MG tablet Take 1 tablet (25 mg total) by mouth daily. 90 tablet 3   methylPREDNISolone (MEDROL DOSEPAK) 4 MG TBPK tablet Take following package directions 21 tablet 0   ondansetron (ZOFRAN ODT) 4 MG disintegrating tablet Take 1 tablet (4 mg total) by mouth every 8 (eight) hours as needed for nausea or vomiting. (Patient not taking: Reported on 03/01/2023) 20 tablet 5   rizatriptan (MAXALT-MLT) 10 MG disintegrating tablet TAKE 1 TABLET EARLIEST ONSET OF MIGRAINE. MAY REPEAT IN 2 HOURS IF NEEDED MAX 2 TAB IN 24 HOURS. (Patient not taking: Reported on 03/01/2023) 10 tablet 0   spironolactone (ALDACTONE) 25 MG tablet Take 1 tablet (25 mg total) by mouth daily. 30 tablet 1   No current facility-administered medications for this visit.   Allergies  Allergen Reactions   Sulfa Antibiotics Hives    Review of Systems: Positive for itching, night sweats, skin rash.  All other systems reviewed and negative except where noted in HPI.   Wt Readings from Last 3 Encounters:  03/01/23 126 lb (57.2 kg)  04/23/21 124 lb (56.2 kg)  04/21/21 124 lb 3.2 oz (56.3 kg)    Physical Exam:  BP 134/80   Pulse 74   Ht 5\' 7"  (1.702 m)   Wt 126 lb (57.2 kg)   BMI 19.73 kg/m  Constitutional:  Pleasant, generally well appearing female in no acute distress. Psychiatric:  Normal mood and affect. Behavior is normal. EENT: Pupils normal.  Conjunctivae are normal. No scleral icterus. Neck supple.  Cardiovascular: Normal rate, regular rhythm.  Pulmonary/chest: Effort normal and breath sounds normal. No wheezing, rales or  rhonchi. Abdominal: Soft, nondistended, nontender. Bowel sounds active throughout. There are no masses palpable. No hepatomegaly. Neurological: Alert and oriented to person place and time.  [name], NP  03/01/2023, 10:29 AM  Cc:  Referring Provider Koren Bound, FNP

## 2023-03-02 LAB — BILIRUBIN, FRACTIONATED(TOT/DIR/INDIR)
Bilirubin, Direct: 0.2 mg/dL (ref 0.0–0.2)
Indirect Bilirubin: 0.9 mg/dL (ref 0.2–1.2)
Total Bilirubin: 1.1 mg/dL (ref 0.2–1.2)

## 2023-03-03 NOTE — Progress Notes (Signed)
Agree with assessment/plan.  Raj Gupta, MD [facility] [phone]  

## 2023-05-14 ENCOUNTER — Ambulatory Visit: Payer: Commercial Managed Care - PPO | Admitting: Nurse Practitioner

## 2023-08-01 ENCOUNTER — Telehealth: Payer: 59 | Admitting: Family Medicine

## 2023-08-01 DIAGNOSIS — N390 Urinary tract infection, site not specified: Secondary | ICD-10-CM | POA: Diagnosis not present

## 2023-08-01 MED ORDER — CIPROFLOXACIN HCL 500 MG PO TABS: 500.0000 mg | ORAL_TABLET | Freq: Two times a day (BID) | ORAL | 0 refills | Status: AC

## 2023-08-01 NOTE — Progress Notes (Signed)
E-Visit for Urinary Problems  We are sorry that you are not feeling well.  Here is how we plan to help!  Based on what you shared with me it looks like you most likely have a simple urinary tract infection.  A UTI (Urinary Tract Infection) is a bacterial infection of the bladder.  Most cases of urinary tract infections are simple to treat but a key part of your care is to encourage you to drink plenty of fluids and watch your symptoms carefully.  I have prescribed cipro.  Your symptoms should gradually improve. Call us if the burning in your urine worsens, you develop worsening fever, back pain or pelvic pain or if your symptoms do not resolve after completing the antibiotic.  Urinary tract infections can be prevented by drinking plenty of water to keep your body hydrated.  Also be sure when you wipe, wipe from front to back and don't hold it in!  If possible, empty your bladder every 4 hours.  HOME CARE Drink plenty of fluids Compete the full course of the antibiotics even if the symptoms resolve Remember, when you need to go.go. Holding in your urine can increase the likelihood of getting a UTI! GET HELP RIGHT AWAY IF: You cannot urinate You get a high fever Worsening back pain occurs You see blood in your urine You feel sick to your stomach or throw up You feel like you are going to pass out  MAKE SURE YOU  Understand these instructions. Will watch your condition. Will get help right away if you are not doing well or get worse.   Thank you for choosing an e-visit.  Your e-visit answers were reviewed by a board certified advanced clinical practitioner to complete your personal care plan. Depending upon the condition, your plan could have included both over the counter or prescription medications.  Please review your pharmacy choice. Make sure the pharmacy is open so you can pick up prescription now. If there is a problem, you may contact your provider through [employer]  and have the prescription routed to another pharmacy.  Your safety is important to [place]. If you have drug allergies check your prescription carefully.   For the next 24 hours you can use MyChart to ask questions about today's visit, request a non-urgent call back, or ask for a work or school excuse. You will get an email in the next two days asking about your experience. I hope that your e-visit has been valuable and will speed your recovery.    have provided 5 minutes of non face to face time during this encounter for chart review and documentation.

## 2024-03-13 ENCOUNTER — Ambulatory Visit
Admission: EM | Admit: 2024-03-13 | Discharge: 2024-03-13 | Disposition: A | Discharge status: Home / Self Care | Attending: Family Medicine | Admitting: Family Medicine

## 2024-03-13 DIAGNOSIS — R030 Elevated blood-pressure reading, without diagnosis of hypertension: Secondary | ICD-10-CM

## 2024-03-13 DIAGNOSIS — H6992 Unspecified Eustachian tube disorder, left ear: Secondary | ICD-10-CM | POA: Diagnosis not present

## 2024-03-13 DIAGNOSIS — R202 Paresthesia of skin: Secondary | ICD-10-CM | POA: Diagnosis not present

## 2024-03-13 DIAGNOSIS — H938X2 Other specified disorders of left ear: Secondary | ICD-10-CM

## 2024-03-13 MED ORDER — LOSARTAN POTASSIUM 25 MG PO TABS: 25.0000 mg | ORAL_TABLET | Freq: Every day | ORAL | 0 refills | Status: AC

## 2024-03-13 MED ORDER — FLUTICASONE PROPIONATE 50 MCG/ACT NA SUSP: 2.0000 | Freq: Every day | NASAL | 0 refills | Status: DC

## 2024-03-13 MED ORDER — CETIRIZINE HCL 10 MG PO TABS: 10.0000 mg | ORAL_TABLET | Freq: Every day | ORAL | 0 refills | Status: DC

## 2024-03-13 NOTE — ED Provider Notes (Signed)
 [facility]RG[facility]  Note:  This document was prepared using Conservation officer, historic buildings and may include unintentional dictation errors.  MRN: [id number] DOB: 04/08/1986  Subjective:   Ashley Horton is a 38 y.o. female presenting for 1 day history of persistent left ear fullness, tingling behind the left now spreading to the left side of her face.  No fever, confusion, overt headache, vision change, sinus pain eye pain, photophobia.  Has previously been managed for migraines and essential hypertension.  Has been worked up for multiple sclerosis.  Did not lead to a definitive diagnosis.  Has never had a stroke, TIA or aneurysm.  Patient is not currently taking any migraine medication for blood pressure medication.  No alcohol use.  No drug use.  No smoking of any kind including cigarettes, cigars, vaping, marijuana use.    No current facility-administered medications for this encounter.  Current Outpatient Medications:    amoxicillin -clavulanate (AUGMENTIN ) 875-125 MG tablet, Take 1 tablet by mouth 2 (two) times daily., Disp: 14 tablet, Rfl: 0   clindamycin -benzoyl peroxide (BENZACLIN) gel, Apply topically 2 (two) times daily., Disp: 25 g, Rfl: 0   cyclobenzaprine  (FLEXERIL ) 10 MG tablet, Take 1 tablet (10 mg total) by mouth 3 (three) times daily as needed for muscle spasms. (Patient not taking: Reported on 03/01/2023), Disp: 30 tablet, Rfl: 0   Erenumab -aooe (AIMOVIG ) 140 MG/ML SOAJ, Inject 140 mg into the skin every 28 (twenty-eight) days., Disp: 1.12 mL, Rfl: 5   Erenumab -aooe 140 MG/ML SOAJ, Inject 140 mg as directed every 28 (twenty-eight) days., Disp: 1 mL, Rfl: 5   Galcanezumab -gnlm (EMGALITY ) 120 MG/ML SOAJ, Inject 120 mg into the skin every 28 (twenty-eight) days., Disp: 1 mL, Rfl: 5   Galcanezumab -gnlm 120 MG/ML SOSY, Inject 240 mg into the skin for 1 dose (loading dose)., Disp: 2 mL, Rfl: 1   hyoscyamine  (LEVSIN ) 0.125 MG tablet, Take 1 tablet (0.125 mg total) by  mouth 2 (two) times daily as needed for cramping (diarrhea,nausea)., Disp: 30 tablet, Rfl: 0   levonorgestrel  (MIRENA , 52 MG,) 20 MCG/24HR IUD, 1 each by Intrauterine route once., Disp: , Rfl:    losartan  (COZAAR ) 25 MG tablet, Take 1 tablet (25 mg total) by mouth daily., Disp: 90 tablet, Rfl: 3   methylPREDNISolone  (MEDROL  DOSEPAK) 4 MG TBPK tablet, Take following package directions, Disp: 21 tablet, Rfl: 0   ondansetron  (ZOFRAN  ODT) 4 MG disintegrating tablet, Take 1 tablet (4 mg total) by mouth every 8 (eight) hours as needed for nausea or vomiting. (Patient not taking: Reported on 03/01/2023), Disp: 20 tablet, Rfl: 5   rizatriptan  (MAXALT -MLT) 10 MG disintegrating tablet, TAKE 1 TABLET EARLIEST ONSET OF MIGRAINE. MAY REPEAT IN 2 HOURS IF NEEDED MAX 2 TAB IN 24 HOURS. (Patient not taking: Reported on 03/01/2023), Disp: 10 tablet, Rfl: 0   spironolactone  (ALDACTONE ) 25 MG tablet, Take 1 tablet (25 mg total) by mouth daily., Disp: 30 tablet, Rfl: 1   Allergies  Allergen Reactions   Sulfa Antibiotics Hives    Past Medical History:  Diagnosis Date   Anemia    1st pregnancy - Fe   Chronic kidney disease    all pregnancy dx pylonephristis - hospitalized + kidney stones   Depression    third child took zoloft for 2-3 mos   History of pyelonephritis during pregnancy    Preterm labor      Past Surgical History:  Procedure Laterality Date   CESAREAN SECTION     urethro dilation  urinary reflux surgery     WISDOM TOOTH EXTRACTION     at age 31    Family History  Problem Relation Age of Onset   Vision loss Mother    Alcohol abuse Father    Depression Father    Drug abuse Father    COPD Father    ADD / ADHD Son    Asthma Maternal Grandmother    Heart disease Paternal Grandmother    Stroke Paternal Grandmother    Cancer Paternal Grandfather    Diabetes Paternal Grandfather    Heart disease Paternal Grandfather    Hyperlipidemia Paternal Grandfather    Hypertension Paternal  Grandfather     Social History   Tobacco Use   Smoking status: Never   Smokeless tobacco: Never  Vaping Use   Vaping status: Never Used  Substance Use Topics   Alcohol use: Not Currently   Drug use: Never    ROS   Objective:   Vitals: BP (!) 166/94 (BP Location: Left Arm)   Pulse 78   Temp 97.8 F (36.6 C) (Oral)   Resp 20   SpO2 98%   BP Readings from Last 3 Encounters:  03/13/24 (!) 166/94  03/01/23 134/80  04/23/21 (!) 146/81    Physical Exam Constitutional:      General: She is not in acute distress.    Appearance: Normal appearance. She is well-developed and normal weight. She is not ill-appearing, toxic-appearing or diaphoretic.  HENT:     Head: Normocephalic and atraumatic.     Right Ear: Tympanic membrane, ear canal and external ear normal. No drainage or tenderness. No middle ear effusion. There is no impacted cerumen. Tympanic membrane is not erythematous or bulging.     Left Ear: Ear canal and external ear normal. No drainage or tenderness.  No middle ear effusion. There is no impacted cerumen. Tympanic membrane is not erythematous or bulging.     Ears:     Comments: Trace middle ear effusion of the left ear.    Nose: Nose normal. No congestion or rhinorrhea.     Mouth/Throat:     Mouth: Mucous membranes are moist. No oral lesions.     Pharynx: No pharyngeal swelling, oropharyngeal exudate, posterior oropharyngeal erythema or uvula swelling.     Tonsils: No tonsillar exudate or tonsillar abscesses.  Eyes:     General: No scleral icterus.       Right eye: No discharge.        Left eye: No discharge.     Extraocular Movements: Extraocular movements intact.     Right eye: Normal extraocular motion.     Left eye: Normal extraocular motion.     Conjunctiva/sclera: Conjunctivae normal.  Neck:     Meningeal: Brudzinski's sign and Kernig's sign absent.  Cardiovascular:     Rate and Rhythm: Normal rate.  Pulmonary:     Effort: Pulmonary effort is normal.   Musculoskeletal:     Cervical back: Normal range of motion and neck supple.  Lymphadenopathy:     Cervical: No cervical adenopathy.  Skin:    General: Skin is warm and dry.  Neurological:     General: No focal deficit present.     Mental Status: She is alert and oriented to person, place, and time.     Cranial Nerves: No cranial nerve deficit, dysarthria or facial asymmetry.     Motor: No weakness or pronator drift.     Coordination: Romberg sign negative. Coordination normal. Finger-Nose-Finger Test and  Heel to [facility] Test normal. Rapid alternating movements normal.     Gait: Gait and tandem walk normal.     Deep Tendon Reflexes: Reflexes normal.  Psychiatric:        Mood and Affect: Mood normal.        Behavior: Behavior normal.        Thought Content: Thought content normal.        Judgment: Judgment normal.    Assessment and Plan :   PDMP not reviewed this encounter.  1. Eustachian tube dysfunction, left   2. Ear fullness, left   3. Elevated blood pressure reading   4. Facial tingling    Reassuring neurologic exam, ENT exam.  Discussed the possibility of an emergency room visit for consideration of imaging but at this time, patient prefers to use conservative management.  Recommended management of ETD with Zyrtec  and Flonase .  It is possible her neurologic symptoms are related to her blood pressure and therefore recommended losartan  at 25 mg once daily.  Patient will follow-up urgently with her PCP or present to the emergency room. Counseled patient on potential for adverse effects with medications prescribed/recommended today, ER and return-to-clinic precautions discussed, patient verbalized understanding.    Christopher Savannah, [place] 03/13/24 1932

## 2024-03-13 NOTE — ED Triage Notes (Signed)
 Pt c/o left ear fullness started yesterday-today she is having tingling behind ear, left side of face and pain to left jaw-last dose ibuprofen  9am with no relief-NAD-steady gait

## 2024-03-15 ENCOUNTER — Other Ambulatory Visit: Payer: Self-pay | Admitting: Specialist

## 2024-03-15 DIAGNOSIS — R200 Anesthesia of skin: Secondary | ICD-10-CM

## 2024-03-15 DIAGNOSIS — I10 Essential (primary) hypertension: Secondary | ICD-10-CM

## 2024-03-15 DIAGNOSIS — Z8669 Personal history of other diseases of the nervous system and sense organs: Secondary | ICD-10-CM

## 2024-04-12 NOTE — Progress Notes (Unsigned)
 [facility]  PATIENT: Ashley Horton DOB: Feb 18, 1986  REFERRING DOCTOR OR PCP:   Eleanor Lobo, NP; Cathlyn Jungling, MD SOURCE: Patient, note from Dr. Skeet, imaging and lab reports, MRI images personally reviewed  _________________________________   HISTORICAL  CHIEF COMPLAINT:  No chief complaint on file.   HISTORY OF PRESENT ILLNESS:  I had the pleasure of seeing your patient, Ashley Horton, at [facility] for neurologic consultation regarding her abnormal brain MRI and migraine headaches.  She has known history of migraine headaches since 2005. *** In 2007, an MRI of the brain showed some white matter foci and she had a lumbar puncture.  CSF was reportedly normal.  We do not have those results.  Imaging personally reviewed: MRI of the brain 03/16/2024 showed scattered small T2/FLAIR hyperintense foci in the subcortical and deep white matter of the cerebral hemispheres.  There has been slight progression compared to the MRI from 04/18/2020.  Normal enhancement pattern.  MRI of the brain from 04/18/2020 compared to the MRI of the brain 12/24/2011.  There are scattered T2/FLAIR hyperintense foci in the subcortical and deep white matter of the cerebral hemispheres.  None of the foci appear to be acute and they do not enhance.  There is very slight progression compared to the MRI from 12/24/2011.  MRI of the cervical spine 11/23/2015 showed a normal spinal cord.  Small disc protrusion at C5-C6  Laboratory:  ESR and CRP were normal 03/14/2024.  Hemoglobin A1c was 5.3 04/03/2022  REVIEW OF SYSTEMS: Constitutional: No fevers, chills, sweats, or change in appetite Eyes: No visual changes, double vision, eye pain Ear, nose and throat: No hearing loss, ear pain, nasal congestion, sore throat Cardiovascular: No chest pain, palpitations Respiratory:  No shortness of breath at rest or with exertion.   No wheezes GastrointestinaI: No nausea, vomiting,  diarrhea, abdominal pain, fecal incontinence Genitourinary:  No dysuria, urinary retention or frequency.  No nocturia. Musculoskeletal:  No neck pain, back pain Integumentary: No rash, pruritus, skin lesions Neurological: as above Psychiatric: No depression at this time.  No anxiety Endocrine: No palpitations, diaphoresis, change in appetite, change in weigh or increased thirst Hematologic/Lymphatic:  No anemia, purpura, petechiae. Allergic/Immunologic: No itchy/runny eyes, nasal congestion, recent allergic reactions, rashes  ALLERGIES: Allergies  Allergen Reactions   Sulfa Antibiotics Hives    HOME MEDICATIONS:  Current Outpatient Medications:    amoxicillin -clavulanate (AUGMENTIN ) 875-125 MG tablet, Take 1 tablet by mouth 2 (two) times daily., Disp: 14 tablet, Rfl: 0   cetirizine  (ZYRTEC  ALLERGY ) 10 MG tablet, Take 1 tablet (10 mg total) by mouth daily., Disp: 30 tablet, Rfl: 0   clindamycin -benzoyl peroxide (BENZACLIN) gel, Apply topically 2 (two) times daily., Disp: 25 g, Rfl: 0   cyclobenzaprine  (FLEXERIL ) 10 MG tablet, Take 1 tablet (10 mg total) by mouth 3 (three) times daily as needed for muscle spasms. (Patient not taking: Reported on 03/01/2023), Disp: 30 tablet, Rfl: 0   Erenumab -aooe (AIMOVIG ) 140 MG/ML SOAJ, Inject 140 mg into the skin every 28 (twenty-eight) days., Disp: 1.12 mL, Rfl: 5   Erenumab -aooe 140 MG/ML SOAJ, Inject 140 mg as directed every 28 (twenty-eight) days., Disp: 1 mL, Rfl: 5   fluticasone  (FLONASE ) 50 MCG/ACT nasal spray, Place 2 sprays into both nostrils daily., Disp: 16 g, Rfl: 0   Galcanezumab -gnlm (EMGALITY ) 120 MG/ML SOAJ, Inject 120 mg into the skin every 28 (twenty-eight) days., Disp: 1 mL, Rfl: 5   Galcanezumab -gnlm 120 MG/ML SOSY, Inject 240 mg into the skin  for 1 dose (loading dose)., Disp: 2 mL, Rfl: 1   hyoscyamine  (LEVSIN ) 0.125 MG tablet, Take 1 tablet (0.125 mg total) by mouth 2 (two) times daily as needed for cramping (diarrhea,nausea).,  Disp: 30 tablet, Rfl: 0   levonorgestrel  (MIRENA , 52 MG,) 20 MCG/24HR IUD, 1 each by Intrauterine route once., Disp: , Rfl:    losartan  (COZAAR ) 25 MG tablet, Take 1 tablet (25 mg total) by mouth daily., Disp: 30 tablet, Rfl: 0   methylPREDNISolone  (MEDROL  DOSEPAK) 4 MG TBPK tablet, Take following package directions, Disp: 21 tablet, Rfl: 0   ondansetron  (ZOFRAN  ODT) 4 MG disintegrating tablet, Take 1 tablet (4 mg total) by mouth every 8 (eight) hours as needed for nausea or vomiting. (Patient not taking: Reported on 03/01/2023), Disp: 20 tablet, Rfl: 5   rizatriptan  (MAXALT -MLT) 10 MG disintegrating tablet, TAKE 1 TABLET EARLIEST ONSET OF MIGRAINE. MAY REPEAT IN 2 HOURS IF NEEDED MAX 2 TAB IN 24 HOURS. (Patient not taking: Reported on 03/01/2023), Disp: 10 tablet, Rfl: 0   spironolactone  (ALDACTONE ) 25 MG tablet, Take 1 tablet (25 mg total) by mouth daily., Disp: 30 tablet, Rfl: 1  PAST MEDICAL HISTORY: Past Medical History:  Diagnosis Date   Anemia    1st pregnancy - Fe   Chronic kidney disease    all pregnancy dx pylonephristis - hospitalized + kidney stones   Depression    third child took zoloft for 2-3 mos   History of pyelonephritis during pregnancy    Preterm labor     PAST SURGICAL HISTORY: Past Surgical History:  Procedure Laterality Date   CESAREAN SECTION     urethro dilation     urinary reflux surgery     WISDOM TOOTH EXTRACTION     at age 2    FAMILY HISTORY: Family History  Problem Relation Age of Onset   Vision loss Mother    Alcohol abuse Father    Depression Father    Drug abuse Father    COPD Father    ADD / ADHD Son    Asthma Maternal Grandmother    Heart disease Paternal Grandmother    Stroke Paternal Grandmother    Cancer Paternal Grandfather    Diabetes Paternal Grandfather    Heart disease Paternal Grandfather    Hyperlipidemia Paternal Grandfather    Hypertension Paternal Grandfather     SOCIAL HISTORY: Social History   Socioeconomic  History   Marital status: Married    Spouse name: Not on file   Number of children: 4   Years of education: Not on file   Highest education level: Not on file  Occupational History   Not on file  Tobacco Use   Smoking status: Never   Smokeless tobacco: Never  Vaping Use   Vaping status: Never Used  Substance and Sexual Activity   Alcohol use: Not Currently   Drug use: Never   Sexual activity: Not on file  Other Topics Concern   Not on file  Social History Narrative   Right handed   Social Drivers of Health   Financial Resource Strain: Low Risk  (03/25/2021)   Received from [facility] visits prior to 09/05/2022.   Overall Financial Resource Strain (CARDIA)    Difficulty of Paying Living Expenses: Not hard at all  Food Insecurity: Low Risk  (08/09/2023)   Received from [facility]   Hunger Vital Sign    Within the past 12 months, you worried that your food would run out before  you got money to buy more: Never true    Within the past 12 months, the food you bought just didn't last and you didn't have money to get more. : Never true  Transportation Needs: No Transportation Needs (04/03/2022)   Received from [facility] visits prior to 09/05/2022., [facility]   PRAPARE - Transportation    Lack of Transportation (Medical): No    Lack of Transportation (Non-Medical): No  Physical Activity: Insufficiently Active (03/25/2021)   Received from [facility] visits prior to 09/05/2022.   Exercise Vital Sign    On average, how many days per week do you engage in moderate to strenuous exercise (like a brisk walk)?: 3 days    On average, how many minutes do you engage in exercise at this level?: 30 min  Stress: No Stress Concern Present (03/25/2021)   Received from [facility] visits prior to 09/05/2022.   [employer] of Occupational Health - Occupational Stress Questionnaire    Feeling of Stress : Only  a little  Social Connections: Moderately Integrated (03/25/2021)   Received from [facility] visits prior to 09/05/2022.   Social Connection and Isolation Panel    In a typical week, how many times do you talk on the phone with family, friends, or neighbors?: Three times a week    How often do you get together with friends or relatives?: Twice a week    How often do you attend church or religious services?: Never    Do you belong to any clubs or organizations such as church groups, unions, fraternal or athletic groups, or school groups?: No    How often do you attend meetings of the clubs or organizations you belong to?: 1 to 4 times per year    Are you married, widowed, divorced, separated, never married, or living with a partner?: Married  Intimate Partner Violence: Unknown (03/25/2021)   Received from [facility] visits prior to 09/05/2022.   Humiliation, Afraid, Rape, and Kick questionnaire    Within the last year, have you been afraid of your partner or ex-partner?: No    Within the last year, have you been humiliated or emotionally abused in other ways by your partner or ex-partner?: No    Within the last year, have you been kicked, hit, slapped, or otherwise physically hurt by your partner or ex-partner?: No    Sexually Abused: Not on file       PHYSICAL EXAM  There were no vitals filed for this visit.  There is no height or weight on file to calculate BMI.   General: The patient is well-developed and well-nourished and in no acute distress  HEENT:  Head is [place]/AT.  Sclera are anicteric.  Funduscopic exam shows normal optic discs and retinal vessels.  Neck: No carotid bruits are noted.  The neck is nontender.  Cardiovascular: The heart has a regular rate and rhythm with a normal S1 and S2. There were no murmurs, gallops or rubs.    Skin: Extremities are without rash or  edema.  Musculoskeletal:  Back is nontender  Neurologic  Exam  Mental status: The patient is alert and oriented x 3 at the time of the examination. The patient has apparent normal recent and remote memory, with an apparently normal attention span and concentration ability.   Speech is normal.  Cranial nerves: Extraocular movements are full. Pupils are equal, round, and reactive to  light and accomodation.  Visual fields are full.  Facial symmetry is present. There is good facial sensation to soft touch bilaterally.Facial strength is normal.  Trapezius and sternocleidomastoid strength is normal. No dysarthria is noted.  The tongue is midline, and the patient has symmetric elevation of the soft palate. No obvious hearing deficits are noted.  Motor:  Muscle bulk is normal.   Tone is normal. Strength is  5 / 5 in all 4 extremities.   Sensory: Sensory testing is intact to pinprick, soft touch and vibration sensation in all 4 extremities.  Coordination: Cerebellar testing reveals good finger-nose-finger and heel-to-shin bilaterally.  Gait and station: Station is normal.   Gait is normal. Tandem gait is normal. Romberg is negative.   Reflexes: Deep tendon reflexes are symmetric and normal bilaterally.   Plantar responses are flexor.    DIAGNOSTIC DATA (LABS, IMAGING, TESTING) - I reviewed patient records, labs, notes, testing and imaging myself where available.  Lab Results  Component Value Date   WBC 4.9 05/16/2019   HGB 14.1 05/16/2019   HCT 42.2 05/16/2019   MCV 91 05/16/2019   PLT 201 11/11/2018      Component Value Date/Time   NA 140 05/16/2019 1044   K 4.0 05/16/2019 1044   CL 101 05/16/2019 1044   CO2 26 05/16/2019 1044   GLUCOSE 94 05/16/2019 1044   GLUCOSE 105 (H) 11/11/2018 1234   BUN 12 05/16/2019 1044   CREATININE 0.78 05/16/2019 1044   CALCIUM 9.5 05/16/2019 1044   PROT 7.1 05/16/2019 1044   ALBUMIN 4.8 05/16/2019 1044   AST 17 05/16/2019 1044   ALT 10 05/16/2019 1044   ALKPHOS 61 05/16/2019 1044   BILITOT 1.1 03/01/2023  1107   BILITOT 1.3 (H) 05/16/2019 1044   GFRNONAA 99 05/16/2019 1044   GFRAA 114 05/16/2019 1044   No results found for: CHOL, HDL, LDLCALC, LDLDIRECT, TRIG, CHOLHDL No results found for: YHAJ8R No results found for: VITAMINB12 Lab Results  Component Value Date   TSH 2.010 05/16/2019       ASSESSMENT AND PLAN  ***   Aliz Meritt A. Vear, MD, [facility] 04/12/2024, 8:20 PM Certified in Neurology, Clinical Neurophysiology, Sleep Medicine and Neuroimaging  [facility] [address], [address] [place], [place] [address] [phone]

## 2024-04-13 ENCOUNTER — Ambulatory Visit: Admitting: Neurology

## 2024-04-13 ENCOUNTER — Encounter: Payer: Self-pay | Admitting: Neurology

## 2024-04-13 VITALS — BP 118/87 | HR 80 | Ht 67.0 in | Wt 126.5 lb

## 2024-04-13 DIAGNOSIS — G43709 Chronic migraine without aura, not intractable, without status migrainosus: Secondary | ICD-10-CM | POA: Diagnosis not present

## 2024-04-13 DIAGNOSIS — R200 Anesthesia of skin: Secondary | ICD-10-CM

## 2024-04-13 DIAGNOSIS — R9082 White matter disease, unspecified: Secondary | ICD-10-CM

## 2024-04-13 DIAGNOSIS — R202 Paresthesia of skin: Secondary | ICD-10-CM | POA: Diagnosis not present

## 2024-04-13 MED ORDER — NORTRIPTYLINE HCL 25 MG PO CAPS: 25.0000 mg | ORAL_CAPSULE | Freq: Every day | ORAL | 5 refills | Status: AC

## 2024-08-01 ENCOUNTER — Ambulatory Visit: Admitting: Gastroenterology

## 2024-08-01 NOTE — Progress Notes (Deleted)
 "  Chief Complaint: stomach pain Primary GI Doctor:Dr. Charlanne  HPI:  Patient is a  39  year old female patient with past medical history of kidney stones, bilateral ureteral reimplantation age 4, mild diverticulosis, who presents for evaluation of ***  Interval History Patient last seen by GI office on 03/01/23 by Vina, NP for Abdominal pain and bowel changes   Patient admits/denies GERD Patient admits/denies dysphagia Patient admits/denies nausea, vomiting, or weight loss  Patient admits/denies altered bowel habits Patient admits/denies abdominal pain Patient admits/denies rectal bleeding   Denies/Admits alcohol Denies/Admits smoking Denies/Admits NSAID use. Denies/Admits they are on blood thinners.  Patients last colonoscopy Patients last EGD  Surgical history:  Patient's family history includes  Wt Readings from Last 3 Encounters:  04/13/24 126 lb 8 oz (57.4 kg)  03/01/23 126 lb (57.2 kg)  04/23/21 124 lb (56.2 kg)      Past Medical History:  Diagnosis Date   Anemia    1st pregnancy - Fe   Chronic kidney disease    all pregnancy dx pylonephristis - hospitalized + kidney stones   Depression    third child took zoloft for 2-3 mos   History of pyelonephritis during pregnancy    HTN (hypertension)    Preterm labor     Past Surgical History:  Procedure Laterality Date   CESAREAN SECTION     urethro dilation     urinary reflux surgery     WISDOM TOOTH EXTRACTION     at age 60    Current Outpatient Medications  Medication Sig Dispense Refill   levonorgestrel  (MIRENA , 52 MG,) 20 MCG/24HR IUD 1 each by Intrauterine route once.     losartan  (COZAAR ) 25 MG tablet Take 1 tablet (25 mg total) by mouth daily. 30 tablet 0   nortriptyline  (PAMELOR ) 25 MG capsule Take 1 capsule (25 mg total) by mouth at bedtime. 30 capsule 5   No current facility-administered medications for this visit.    Allergies as of 08/01/2024 - Review Complete 04/13/2024  Allergen  Reaction Noted   Sulfa antibiotics Hives 09/29/2016    Family History  Problem Relation Age of Onset   Vision loss Mother    Alcohol abuse Father    Depression Father    Drug abuse Father    COPD Father    ADD / ADHD Son    Asthma Maternal Grandmother    Heart disease Paternal Grandmother    Stroke Paternal Grandmother    Cancer Paternal Grandfather    Diabetes Paternal Grandfather    Heart disease Paternal Grandfather    Hyperlipidemia Paternal Grandfather    Hypertension Paternal Grandfather     Review of Systems:    Constitutional: No weight loss, fever, chills, weakness or fatigue HEENT: Eyes: No change in vision               Ears, Nose, Throat:  No change in hearing or congestion Skin: No rash or itching Cardiovascular: No chest pain, chest pressure or palpitations   Respiratory: No SOB or cough Gastrointestinal: See HPI and otherwise negative Genitourinary: No dysuria or change in urinary frequency Neurological: No headache, dizziness or syncope Musculoskeletal: No new muscle or joint pain Hematologic: No bleeding or bruising Psychiatric: No history of depression or anxiety    Physical Exam:  Vital signs: There were no vitals taken for this visit.  Constitutional:   Pleasant *** female/female appears to be in NAD, Well developed, Well nourished, alert and cooperative Eyes:   PEERL, EOMI.  No icterus. Conjunctiva pink. Neck:  Supple Throat: Oral cavity and pharynx without inflammation, swelling or lesion.  Respiratory: Respirations even and unlabored. Lungs clear to auscultation bilaterally.   No wheezes, crackles, or rhonchi.  Cardiovascular: Normal S1, S2. Regular rate and rhythm. No peripheral edema, cyanosis or pallor.  Gastrointestinal:  Soft, nondistended, nontender. No rebound or guarding. Normal bowel sounds. No appreciable masses or hepatomegaly. Rectal:  Not performed.  Anoscopy: Msk:  Symmetrical without gross deformities. Without edema, no deformity or  joint abnormality.  Neurologic:  Alert and  oriented x4;  grossly normal neurologically.  Skin:   Dry and intact without significant lesions or rashes.  RELEVANT LABS AND IMAGING: CBC    Latest Ref Rng & Units 05/16/2019   10:44 AM 11/11/2018   12:34 PM 02/18/2018    6:09 AM  CBC  WBC 3.4 - 10.8 x10E3/uL 4.9  6.2  9.7   Hemoglobin 11.1 - 15.9 g/dL 85.8  85.9  9.7   Hematocrit 34.0 - 46.6 % 42.2  44.6  28.2   Platelets 150 - 400 K/uL  201  146      CMP     Latest Ref Rng & Units 03/01/2023   11:07 AM 05/16/2019   10:44 AM 11/11/2018   12:34 PM  CMP  Glucose 65 - 99 mg/dL  94  894   BUN 6 - 20 mg/dL  12  12   Creatinine 9.42 - 1.00 mg/dL  9.21  8.94   Sodium 865 - 144 mmol/L  140  140   Potassium 3.5 - 5.2 mmol/L  4.0  3.9   Chloride 96 - 106 mmol/L  101  105   CO2 20 - 29 mmol/L  26  21   Calcium 8.7 - 10.2 mg/dL  9.5  9.4   Total Protein 6.0 - 8.5 g/dL  7.1    Total Bilirubin 0.2 - 1.2 mg/dL 1.1  1.3    Alkaline Phos 39 - 117 IU/L  61    AST 0 - 40 IU/L  17    ALT 0 - 32 IU/L  10       Lab Results  Component Value Date   TSH 2.010 05/16/2019   12/01/2022.  CBC was normal. CMP normal except for T bili of 1.8 ( chronically elevated).  CT scan was unrevealing   CT AP with contrast 12/01/22 IMPRESSION: 1. Heterogeneous enhancement of the left-greater-than-right kidneys with associated areas of focal cortical thinning, likely due to chronic scarring. Recommend correlation with urinalysis to exclude infection. 2. Punctate bilateral nonobstructing renal stones   Assessment: 1.  2. Chronically elevated Bilirubin < 2. Fractionated bilirubin (02/2023) direct 0.2/indirect 0.9, suggesting Gilberts syndrome  Plan: 1. ***   Thank you for the courtesy of this consult. Please call me with any questions or concerns.   Ericia Moxley, FNP-C [facility] 08/01/2024, 8:13 AM  Cc: Thurmond Cathlyn LABOR., MD  "

## 2024-11-14 ENCOUNTER — Ambulatory Visit: Admitting: Neurology
